# Patient Record
Sex: Female | Born: 1935 | Race: White | Hispanic: No | Marital: Married | Smoking: Never smoker
Health system: Southern US, Community
[De-identification: ages and names within clinical notes are randomized; demographics above are authoritative.]

## PROBLEM LIST (undated history)

## (undated) DIAGNOSIS — M810 Age-related osteoporosis without current pathological fracture: Secondary | ICD-10-CM

## (undated) DIAGNOSIS — F419 Anxiety disorder, unspecified: Secondary | ICD-10-CM

## (undated) DIAGNOSIS — D0510 Intraductal carcinoma in situ of unspecified breast: Secondary | ICD-10-CM

## (undated) DIAGNOSIS — I319 Disease of pericardium, unspecified: Secondary | ICD-10-CM

## (undated) HISTORY — DX: Disease of pericardium, unspecified: I31.9

## (undated) HISTORY — DX: Age-related osteoporosis without current pathological fracture: M81.0

## (undated) HISTORY — DX: Anxiety disorder, unspecified: F41.9

## (undated) HISTORY — DX: Intraductal carcinoma in situ of unspecified breast: D05.10

## (undated) HISTORY — PX: BREAST LUMPECTOMY: SHX2

---

## 1997-12-10 ENCOUNTER — Other Ambulatory Visit: Admission: RE | Admit: 1997-12-10 | Discharge: 1997-12-10 | Payer: Self-pay | Admitting: Obstetrics and Gynecology

## 1998-11-29 ENCOUNTER — Ambulatory Visit (HOSPITAL_COMMUNITY): Admission: RE | Admit: 1998-11-29 | Discharge: 1998-11-29 | Payer: Self-pay | Admitting: Gastroenterology

## 1999-01-03 ENCOUNTER — Other Ambulatory Visit: Admission: RE | Admit: 1999-01-03 | Discharge: 1999-01-03 | Payer: Self-pay | Admitting: Obstetrics and Gynecology

## 2000-01-16 ENCOUNTER — Other Ambulatory Visit: Admission: RE | Admit: 2000-01-16 | Discharge: 2000-01-16 | Payer: Self-pay | Admitting: Obstetrics and Gynecology

## 2001-01-05 ENCOUNTER — Other Ambulatory Visit: Admission: RE | Admit: 2001-01-05 | Discharge: 2001-01-05 | Payer: Self-pay | Admitting: Radiology

## 2001-02-02 ENCOUNTER — Other Ambulatory Visit: Admission: RE | Admit: 2001-02-02 | Discharge: 2001-02-02 | Payer: Self-pay | Admitting: Radiology

## 2001-02-10 ENCOUNTER — Ambulatory Visit: Admission: RE | Admit: 2001-02-10 | Discharge: 2001-05-11 | Payer: Self-pay | Admitting: *Deleted

## 2001-02-10 ENCOUNTER — Other Ambulatory Visit: Admission: RE | Admit: 2001-02-10 | Discharge: 2001-02-10 | Payer: Self-pay | Admitting: Obstetrics and Gynecology

## 2002-02-28 ENCOUNTER — Other Ambulatory Visit: Admission: RE | Admit: 2002-02-28 | Discharge: 2002-02-28 | Payer: Self-pay | Admitting: Obstetrics and Gynecology

## 2003-03-26 ENCOUNTER — Other Ambulatory Visit: Admission: RE | Admit: 2003-03-26 | Discharge: 2003-03-26 | Payer: Self-pay | Admitting: Obstetrics and Gynecology

## 2011-08-27 ENCOUNTER — Other Ambulatory Visit: Payer: Self-pay | Admitting: Internal Medicine

## 2011-08-27 DIAGNOSIS — R102 Pelvic and perineal pain: Secondary | ICD-10-CM

## 2011-08-31 ENCOUNTER — Ambulatory Visit
Admission: RE | Admit: 2011-08-31 | Discharge: 2011-08-31 | Disposition: A | Discharge status: Home / Self Care | Payer: Medicare Other | Source: Ambulatory Visit | Attending: Internal Medicine | Admitting: Internal Medicine

## 2011-08-31 DIAGNOSIS — R102 Pelvic and perineal pain: Secondary | ICD-10-CM

## 2014-08-27 DIAGNOSIS — K120 Recurrent oral aphthae: Secondary | ICD-10-CM | POA: Diagnosis not present

## 2014-09-26 DIAGNOSIS — L820 Inflamed seborrheic keratosis: Secondary | ICD-10-CM | POA: Diagnosis not present

## 2014-09-26 DIAGNOSIS — D485 Neoplasm of uncertain behavior of skin: Secondary | ICD-10-CM | POA: Diagnosis not present

## 2014-10-29 DIAGNOSIS — E780 Pure hypercholesterolemia: Secondary | ICD-10-CM | POA: Diagnosis not present

## 2014-10-29 DIAGNOSIS — Z0000 Encounter for general adult medical examination without abnormal findings: Secondary | ICD-10-CM | POA: Diagnosis not present

## 2014-10-29 DIAGNOSIS — M545 Low back pain: Secondary | ICD-10-CM | POA: Diagnosis not present

## 2014-10-29 DIAGNOSIS — I319 Disease of pericardium, unspecified: Secondary | ICD-10-CM | POA: Diagnosis not present

## 2014-11-14 DIAGNOSIS — Z23 Encounter for immunization: Secondary | ICD-10-CM | POA: Diagnosis not present

## 2014-11-14 DIAGNOSIS — M160 Bilateral primary osteoarthritis of hip: Secondary | ICD-10-CM | POA: Diagnosis not present

## 2014-11-14 DIAGNOSIS — M545 Low back pain: Secondary | ICD-10-CM | POA: Diagnosis not present

## 2014-11-14 DIAGNOSIS — Z0000 Encounter for general adult medical examination without abnormal findings: Secondary | ICD-10-CM | POA: Diagnosis not present

## 2014-11-14 DIAGNOSIS — M47816 Spondylosis without myelopathy or radiculopathy, lumbar region: Secondary | ICD-10-CM | POA: Diagnosis not present

## 2014-11-22 DIAGNOSIS — M545 Low back pain: Secondary | ICD-10-CM | POA: Diagnosis not present

## 2014-11-28 DIAGNOSIS — M545 Low back pain: Secondary | ICD-10-CM | POA: Diagnosis not present

## 2014-12-26 DIAGNOSIS — Z853 Personal history of malignant neoplasm of breast: Secondary | ICD-10-CM | POA: Diagnosis not present

## 2014-12-26 DIAGNOSIS — Z1231 Encounter for screening mammogram for malignant neoplasm of breast: Secondary | ICD-10-CM | POA: Diagnosis not present

## 2015-01-23 DIAGNOSIS — Z01419 Encounter for gynecological examination (general) (routine) without abnormal findings: Secondary | ICD-10-CM | POA: Diagnosis not present

## 2015-01-23 DIAGNOSIS — Z6825 Body mass index (BMI) 25.0-25.9, adult: Secondary | ICD-10-CM | POA: Diagnosis not present

## 2015-03-15 DIAGNOSIS — H2513 Age-related nuclear cataract, bilateral: Secondary | ICD-10-CM | POA: Diagnosis not present

## 2015-03-15 DIAGNOSIS — H1789 Other corneal scars and opacities: Secondary | ICD-10-CM | POA: Diagnosis not present

## 2015-03-15 DIAGNOSIS — Z0100 Encounter for examination of eyes and vision without abnormal findings: Secondary | ICD-10-CM | POA: Diagnosis not present

## 2015-05-27 DIAGNOSIS — Z23 Encounter for immunization: Secondary | ICD-10-CM | POA: Diagnosis not present

## 2015-09-04 DIAGNOSIS — N644 Mastodynia: Secondary | ICD-10-CM | POA: Diagnosis not present

## 2015-09-24 DIAGNOSIS — N644 Mastodynia: Secondary | ICD-10-CM | POA: Diagnosis not present

## 2015-09-24 DIAGNOSIS — Z853 Personal history of malignant neoplasm of breast: Secondary | ICD-10-CM | POA: Diagnosis not present

## 2015-10-07 DIAGNOSIS — L649 Androgenic alopecia, unspecified: Secondary | ICD-10-CM | POA: Diagnosis not present

## 2015-10-07 DIAGNOSIS — L814 Other melanin hyperpigmentation: Secondary | ICD-10-CM | POA: Diagnosis not present

## 2015-12-16 ENCOUNTER — Other Ambulatory Visit: Payer: Self-pay | Admitting: Internal Medicine

## 2015-12-16 DIAGNOSIS — R911 Solitary pulmonary nodule: Secondary | ICD-10-CM

## 2015-12-19 ENCOUNTER — Other Ambulatory Visit: Payer: BC Managed Care – PPO

## 2015-12-30 ENCOUNTER — Other Ambulatory Visit: Payer: BC Managed Care – PPO

## 2016-01-01 ENCOUNTER — Ambulatory Visit
Admission: RE | Admit: 2016-01-01 | Discharge: 2016-01-01 | Disposition: A | Discharge status: Home / Self Care | Payer: PRIVATE HEALTH INSURANCE | Source: Ambulatory Visit | Attending: Internal Medicine | Admitting: Internal Medicine

## 2016-01-01 DIAGNOSIS — R911 Solitary pulmonary nodule: Secondary | ICD-10-CM

## 2016-01-01 MED ORDER — IOPAMIDOL (ISOVUE-300) INJECTION 61%
75.0000 mL | Freq: Once | INTRAVENOUS | Status: AC | PRN
  Administered 2016-01-01: 75 mL via INTRAVENOUS

## 2016-01-09 ENCOUNTER — Ambulatory Visit: Payer: BC Managed Care – PPO | Admitting: Cardiovascular Disease

## 2016-01-15 ENCOUNTER — Other Ambulatory Visit: Payer: Self-pay | Admitting: Internal Medicine

## 2016-01-15 DIAGNOSIS — R911 Solitary pulmonary nodule: Secondary | ICD-10-CM

## 2016-01-23 ENCOUNTER — Telehealth: Payer: Self-pay | Admitting: Cardiology

## 2016-01-23 NOTE — Telephone Encounter (Signed)
Received records from [facility] for appointment on 05/07/16 with Dr Martinique.  Records given to [facility] (medical records) for Dr Doug Sou schedule on 05/07/16. lp

## 2016-03-03 ENCOUNTER — Ambulatory Visit
Admission: RE | Admit: 2016-03-03 | Discharge: 2016-03-03 | Disposition: A | Discharge status: Home / Self Care | Payer: Medicare Other | Source: Ambulatory Visit | Attending: Internal Medicine | Admitting: Internal Medicine

## 2016-03-03 DIAGNOSIS — R911 Solitary pulmonary nodule: Secondary | ICD-10-CM

## 2016-05-06 NOTE — Progress Notes (Deleted)
   Cardiology Office Note    Date:  05/06/2016   ID:  Stacie Gray, DOB 26-Dec-1933, MRN [id number]  PCP:  No primary care provider on file.  Cardiologist:  Carrera Kiesel Martinique, MD    History of Present Illness:  Stacie Gray is a 82 y.o. female ***    No past medical history on file.  No past surgical history on file.  Current Medications: No outpatient prescriptions prior to visit.   No facility-administered medications prior to visit.      Allergies:   Review of patient's allergies indicates not on file.   Social History   Social History  . Marital status: Married    Spouse name: N/A  . Number of children: N/A  . Years of education: N/A   Social History Main Topics  . Smoking status: Not on file  . Smokeless tobacco: Not on file  . Alcohol use Not on file  . Drug use: Unknown  . Sexual activity: Not on file   Other Topics Concern  . Not on file   Social History Narrative  . No narrative on file     Family History:  The patient's ***family history is not on file.   ROS:   Please see the history of present illness.    ROS All other systems reviewed and are negative.   PHYSICAL EXAM:   VS:  There were no vitals taken for this visit.   GEN: Well nourished, well developed, in no acute distress  HEENT: normal  Neck: no JVD, carotid bruits, or masses Cardiac: ***RRR; no murmurs, rubs, or gallops,no edema  Respiratory:  clear to auscultation bilaterally, normal work of breathing GI: soft, nontender, nondistended, + BS MS: no deformity or atrophy  Skin: warm and dry, no rash Neuro:  Alert and Oriented x 3, Strength and sensation are intact Psych: euthymic mood, full affect  Wt Readings from Last 3 Encounters:  No data found for Wt      Studies/Labs Reviewed:   EKG:  EKG is*** ordered today.  The ekg ordered today demonstrates ***  Recent Labs: No results found for requested labs within last 8760 hours.   Lipid Panel No results found  for: CHOL, TRIG, HDL, CHOLHDL, VLDL, LDLCALC, LDLDIRECT  Additional studies/ records that were reviewed today include:  Labs from primary care dated 12/03/15: cholesterol 173, triglycerides 102, LDL 105, HDL 48.  Dated 03/09/16: BUN 18, creatinine 0.95, other chemistries normal. TSH normal.   .padscreen  ASSESSMENT:    No diagnosis found.   PLAN:  In order of problems listed above:  1. ***    Medication Adjustments/Labs and Tests Ordered: Current medicines are reviewed at length with the patient today.  Concerns regarding medicines are outlined above.  Medication changes, Labs and Tests ordered today are listed in the Patient Instructions below. There are no Patient Instructions on file for this visit.   Signed, Hadassa Cermak Martinique, MD  05/06/2016 4:47 PM    [facility] [address] [place], [place], [address] [phone]

## 2016-05-07 ENCOUNTER — Encounter: Payer: Self-pay | Admitting: *Deleted

## 2016-05-07 ENCOUNTER — Ambulatory Visit: Payer: Medicare Other | Admitting: Cardiology

## 2016-05-20 ENCOUNTER — Encounter: Payer: Self-pay | Admitting: Internal Medicine

## 2016-05-20 ENCOUNTER — Ambulatory Visit (INDEPENDENT_AMBULATORY_CARE_PROVIDER_SITE_OTHER): Payer: Medicare Other | Admitting: Internal Medicine

## 2016-05-20 VITALS — BP 152/76 | HR 62 | Ht 65.0 in | Wt 147.0 lb

## 2016-05-20 DIAGNOSIS — F419 Anxiety disorder, unspecified: Secondary | ICD-10-CM | POA: Insufficient documentation

## 2016-05-20 DIAGNOSIS — R0602 Shortness of breath: Secondary | ICD-10-CM | POA: Diagnosis not present

## 2016-05-20 DIAGNOSIS — R9431 Abnormal electrocardiogram [ECG] [EKG]: Secondary | ICD-10-CM | POA: Insufficient documentation

## 2016-05-20 DIAGNOSIS — Z8679 Personal history of other diseases of the circulatory system: Secondary | ICD-10-CM | POA: Insufficient documentation

## 2016-05-20 NOTE — Progress Notes (Signed)
OFFICE NOTE  Chief Complaint:  Dyspnea on exertion  Primary Care Physician: Stacie Gravel, MD  HPI:  Stacie Gray is a 82 y.o. female with a past medical history significant for remote pericarditis in the setting of fever however she did not have any chest pain in 1996, DCIS status post lumpectomy in 2002, anxiety and osteoarthritis. Recently she's been having some more shortness of breath which she feels may be related to anxiety. She's been caring for her husband who is COPD. She's had several episodes where she felt like she was anxious and her heart was racing however the anxiety likely preceded the tachycardia. She denies any chest pain but does get short of breath when doing some activities. There is no significant family history of coronary disease and she has few cardiac risk factors other than age. There was a remote history of possible pericarditis in the 1990s where she had fever but did not have chest discomfort. She had an echo at that time, but those results are not immediately available. EKG was performed in the office today which was abnormal showing sinus rhythm at 62, possible anterior infarct pattern and T-wave abnormality laterally concerning for ischemia.  PMHx:  Past Medical History:  Diagnosis Date  . Anxiety   . DCIS (ductal carcinoma in situ)   . Osteoporosis   . Pericarditis     Past Surgical History:  Procedure Laterality Date  . BREAST LUMPECTOMY      FAMHx:  Family History  Problem Relation Age of Onset  . Heart disease Paternal Grandmother   . Stroke Paternal Grandfather     SOCHx:   reports that she has never smoked. She has never used smokeless tobacco. She reports that she does not drink alcohol or use drugs.  ALLERGIES:  No Known Allergies  ROS: Pertinent items noted in HPI and remainder of comprehensive ROS otherwise negative.  HOME MEDS: No current outpatient prescriptions on file prior to visit.   No current  facility-administered medications on file prior to visit.     LABS/IMAGING: No results found for this or any previous visit (from the past 48 hour(s)). No results found.  WEIGHTS: Wt Readings from Last 3 Encounters:  05/20/16 147 lb (66.7 kg)    VITALS: BP (!) 152/76   Pulse 62   Ht 5\' 5"  (1.651 m)   Wt 147 lb (66.7 kg)   BMI 24.46 kg/m   EXAM: General appearance: alert and no distress Neck: no carotid bruit and no JVD Lungs: clear to auscultation bilaterally Heart: regular rate and rhythm, S1, S2 normal, no murmur, click, rub or gallop Abdomen: soft, non-tender; bowel sounds normal; no masses,  no organomegaly Extremities: extremities normal, atraumatic, no cyanosis or edema Pulses: 2+ and symmetric Skin: Skin color, texture, turgor normal. No rashes or lesions Neurologic: Grossly normal Psych: Pleasant  EKG: Normal sinus rhythm at 62, possible anterior infarct with poor R-wave progression and lateral ischemia and T-wave changes  ASSESSMENT: 1. Progressive dyspnea on exertion 2. Abnormal EKG with ischemic changes 3. Anxiety 4. Remote history of pericarditis  PLAN: 1.   Stacie Gray presents for some recent dyspnea with exertion. She feels that this may be related to anxiety panic. There is some racing of her heart that happens when she is anxious. She's been under a lot of stress caring for her husband who is COPD on oxygen. Her symptoms of recently worsened and there are abnormal EKG changes that could suggest ischemia. I recommend a  Lexiscan Myoview to rule out ischemia. She is unable to walk on a treadmill due to sciatic symptoms on her right leg. She does have a history of remote pericarditis an echo in the 1990s. I like to reassess echo as well for her shortness of breath. Plan to see her back in follow-up of the studies in a few weeks.  Thanks again for the kind referral.  Pixie Casino, MD, [facility] Attending Cardiologist [place] 05/20/2016, 10:28 AM

## 2016-05-20 NOTE — Patient Instructions (Signed)
Medication Instructions:  Your physician recommends that you continue on your current medications as directed. Please refer to the Current Medication list given to you today.  Labwork: None   Testing/Procedures: Your physician has requested that you have a lexiscan myoview. For further information please visit [email or web address]. Please follow instruction sheet, as given.  Your physician has requested that you have an echocardiogram. Echocardiography is a painless test that uses sound waves to create images of your heart. It provides your doctor with information about the size and shape of your heart and how well your heart's chambers and valves are working. This procedure takes approximately one hour. There are no restrictions for this procedure.   Follow-Up: Your physician recommends that you schedule a follow-up appointment in: AFTER TESTING IS COMPLETE  Any Other Special Instructions Will Be Listed Below (If Applicable).   If you need a refill on your cardiac medications before your next appointment, please call your pharmacy.  NEXT APPOINTMENT with [facility]  DATE:______________________________________  TIME:______________________________________AM/PM

## 2016-05-25 DIAGNOSIS — L821 Other seborrheic keratosis: Secondary | ICD-10-CM | POA: Diagnosis not present

## 2016-05-25 DIAGNOSIS — L820 Inflamed seborrheic keratosis: Secondary | ICD-10-CM | POA: Diagnosis not present

## 2016-05-25 DIAGNOSIS — L720 Epidermal cyst: Secondary | ICD-10-CM | POA: Diagnosis not present

## 2016-05-25 DIAGNOSIS — L723 Sebaceous cyst: Secondary | ICD-10-CM | POA: Diagnosis not present

## 2016-05-25 DIAGNOSIS — L738 Other specified follicular disorders: Secondary | ICD-10-CM | POA: Diagnosis not present

## 2016-06-02 ENCOUNTER — Telehealth (HOSPITAL_COMMUNITY): Payer: Self-pay

## 2016-06-02 ENCOUNTER — Other Ambulatory Visit: Payer: Self-pay

## 2016-06-02 ENCOUNTER — Ambulatory Visit (HOSPITAL_COMMUNITY): Payer: Medicare Other | Attending: Cardiology

## 2016-06-02 DIAGNOSIS — I5030 Unspecified diastolic (congestive) heart failure: Secondary | ICD-10-CM | POA: Diagnosis not present

## 2016-06-02 DIAGNOSIS — Z8679 Personal history of other diseases of the circulatory system: Secondary | ICD-10-CM | POA: Insufficient documentation

## 2016-06-02 DIAGNOSIS — R0602 Shortness of breath: Secondary | ICD-10-CM | POA: Insufficient documentation

## 2016-06-02 NOTE — Telephone Encounter (Signed)
Encounter complete. 

## 2016-06-04 ENCOUNTER — Ambulatory Visit (HOSPITAL_COMMUNITY)
Admission: RE | Admit: 2016-06-04 | Discharge: 2016-06-04 | Disposition: A | Discharge status: Home / Self Care | Payer: Medicare Other | Source: Ambulatory Visit | Attending: Cardiovascular Disease | Admitting: Cardiovascular Disease

## 2016-06-04 DIAGNOSIS — R0602 Shortness of breath: Secondary | ICD-10-CM

## 2016-06-04 DIAGNOSIS — Z8679 Personal history of other diseases of the circulatory system: Secondary | ICD-10-CM | POA: Diagnosis not present

## 2016-06-04 LAB — MYOCARDIAL PERFUSION IMAGING
CHL CUP NUCLEAR SDS: 0
CHL CUP NUCLEAR SRS: 0
CHL CUP NUCLEAR SSS: 0
CSEPPHR: 91 {beats}/min
LV dias vol: 71 mL (ref 46–106)
LV sys vol: 26 mL
Rest HR: 53 {beats}/min
TID: 1.31

## 2016-06-04 MED ORDER — TECHNETIUM TC 99M TETROFOSMIN IV KIT
10.5000 | PACK | Freq: Once | INTRAVENOUS | Status: AC | PRN
  Administered 2016-06-04: 10.5 via INTRAVENOUS
  Filled 2016-06-04: qty 11

## 2016-06-04 MED ORDER — AMINOPHYLLINE 25 MG/ML IV SOLN
75.0000 mg | Freq: Once | INTRAVENOUS | Status: AC
  Administered 2016-06-04: 75 mg via INTRAVENOUS

## 2016-06-04 MED ORDER — TECHNETIUM TC 99M TETROFOSMIN IV KIT
30.7000 | PACK | Freq: Once | INTRAVENOUS | Status: AC | PRN
  Administered 2016-06-04: 30.7 via INTRAVENOUS
  Filled 2016-06-04: qty 31

## 2016-06-04 MED ORDER — REGADENOSON 0.4 MG/5ML IV SOLN
0.4000 mg | Freq: Once | INTRAVENOUS | Status: AC
  Administered 2016-06-04: 0.4 mg via INTRAVENOUS

## 2016-07-08 ENCOUNTER — Ambulatory Visit: Payer: Medicare Other | Admitting: Internal Medicine

## 2016-07-20 DIAGNOSIS — Z23 Encounter for immunization: Secondary | ICD-10-CM | POA: Diagnosis not present

## 2016-07-28 ENCOUNTER — Ambulatory Visit (INDEPENDENT_AMBULATORY_CARE_PROVIDER_SITE_OTHER): Payer: Medicare Other | Admitting: Internal Medicine

## 2016-07-28 ENCOUNTER — Encounter: Payer: Self-pay | Admitting: Internal Medicine

## 2016-07-28 VITALS — BP 149/80 | HR 63 | Ht 65.0 in | Wt 143.6 lb

## 2016-07-28 DIAGNOSIS — R0602 Shortness of breath: Secondary | ICD-10-CM

## 2016-07-28 DIAGNOSIS — R9431 Abnormal electrocardiogram [ECG] [EKG]: Secondary | ICD-10-CM | POA: Diagnosis not present

## 2016-07-28 DIAGNOSIS — I10 Essential (primary) hypertension: Secondary | ICD-10-CM | POA: Diagnosis not present

## 2016-07-28 MED ORDER — VALSARTAN 80 MG PO TABS: 80.0000 mg | ORAL_TABLET | Freq: Every day | ORAL | 6 refills | Status: DC

## 2016-07-28 NOTE — Progress Notes (Signed)
OFFICE NOTE  Chief Complaint:  Dyspnea on exertion, headache  Primary Care Physician: Jani Gravel, MD  HPI:  Stacie Gray is a 82 y.o. female with a past medical history significant for remote pericarditis in the setting of fever however she did not have any chest pain in 1996, DCIS status post lumpectomy in 2002, anxiety and osteoarthritis. Recently she's been having some more shortness of breath which she feels may be related to anxiety. She's been caring for her husband who is COPD. She's had several episodes where she felt like she was anxious and her heart was racing however the anxiety likely preceded the tachycardia. She denies any chest pain but does get short of breath when doing some activities. There is no significant family history of coronary disease and she has few cardiac risk factors other than age. There was a remote history of possible pericarditis in the 1990s where she had fever but did not have chest discomfort. She had an echo at that time, but those results are not immediately available. EKG was performed in the office today which was abnormal showing sinus rhythm at 62, possible anterior infarct pattern and T-wave abnormality laterally concerning for ischemia.  07/28/2016  Stacie Gray returns today for follow-up. I'm pleased to report her nuclear stress test was negative for ischemia. LV function is stable. Her echo shows normal LV systolic function with mild diastolic dysfunction and trivial aortic insufficiency. This does not necessarily explain her shortness of breath or chest discomfort. She is under significant amount of stress however caring for her husband and I think that's also contributing to her issues. Blood pressure notably was elevated previously 152/76. Today it's elevated 149/80. I suspect the hypertension could be causing some of her symptoms. She does report some tension over her temples which may occur in the afternoons and could represent a  manifestation of her hypertension.  PMHx:  Past Medical History:  Diagnosis Date  . Anxiety   . DCIS (ductal carcinoma in situ)   . Osteoporosis   . Pericarditis     Past Surgical History:  Procedure Laterality Date  . BREAST LUMPECTOMY      FAMHx:  Family History  Problem Relation Age of Onset  . Heart disease Paternal Grandmother   . Stroke Paternal Grandfather     SOCHx:   reports that she has never smoked. She has never used smokeless tobacco. She reports that she does not drink alcohol or use drugs.  ALLERGIES:  No Known Allergies  ROS: Pertinent items noted in HPI and remainder of comprehensive ROS otherwise negative.  HOME MEDS: Current Outpatient Prescriptions on File Prior to Visit  Medication Sig Dispense Refill  . Cholecalciferol (VITAMIN D3 PO) Take 500 mg by mouth daily.    . Glucosamine-Chondroit-Vit C-Mn (GLUCOSAMINE 1500 COMPLEX PO) Take 1 tablet by mouth daily.    . Magnesium 500 MG TABS Take 500 mg by mouth daily.    . meloxicam (MOBIC) 15 MG tablet Take 15 mg by mouth daily.     No current facility-administered medications on file prior to visit.     LABS/IMAGING: No results found for this or any previous visit (from the past 48 hour(s)). No results found.  WEIGHTS: Wt Readings from Last 3 Encounters:  07/28/16 143 lb 9.6 oz (65.1 kg)  06/04/16 147 lb (66.7 kg)  05/20/16 147 lb (66.7 kg)    VITALS: BP (!) 149/80   Pulse 63   Ht 5\' 5"  (1.651 m)  Wt 143 lb 9.6 oz (65.1 kg)   BMI 23.90 kg/m   EXAM: Deferred  EKG: Deferred  ASSESSMENT: 1. Progressive dyspnea on exertion - low risk Myoview and normal LV EF on echo (06/2016) 2. Abnormal EKG with ischemic changes 3. Anxiety 4. Remote history of pericarditis 5. Hypertension  PLAN: 1.   Stacie Gray has had some dyspnea and chest tightness as well as mild abnormal feeling in her head which sounds like possible headache. Blood pressure has been consistently elevated which  suggested it could be a cause of her symptoms. I would recommend starting low-dose Diovan 80 mg daily. Will plan a follow-up appointment in a few weeks to reassess blood pressure and see if she's had any symptomatic improvement. Follow-up with me in 6 months.  Pixie Casino, MD, [facility] Attending Cardiologist [place] 07/28/2016, 3:16 PM

## 2016-07-28 NOTE — Patient Instructions (Signed)
Medication addition  Start valsartan 80 mg one tablet by mouth daily    Your physician recommends that you schedule a follow-up appointment in JAN 2018 [place]- BLOOD PRESSURE.    Your physician wants you to follow-up in [place]. You will receive a reminder letter in the mail two months in advance. If you don't receive a letter, please call our office to schedule the follow-up appointment.  If you need a refill on your cardiac medications before your next appointment, please call your pharmacy.

## 2016-09-03 ENCOUNTER — Ambulatory Visit: Payer: Medicare Other

## 2016-09-08 ENCOUNTER — Ambulatory Visit (INDEPENDENT_AMBULATORY_CARE_PROVIDER_SITE_OTHER): Payer: Medicare Other | Admitting: Pharmacist Clinician (PhC)/ Clinical Pharmacy Specialist

## 2016-09-08 ENCOUNTER — Other Ambulatory Visit: Payer: Self-pay | Admitting: Internal Medicine

## 2016-09-08 DIAGNOSIS — R911 Solitary pulmonary nodule: Secondary | ICD-10-CM

## 2016-09-08 DIAGNOSIS — I10 Essential (primary) hypertension: Secondary | ICD-10-CM | POA: Diagnosis not present

## 2016-09-08 MED ORDER — VALSARTAN 80 MG PO TABS: 80.0000 mg | ORAL_TABLET | Freq: Every day | ORAL | 1 refills | Status: DC

## 2016-09-08 NOTE — Progress Notes (Signed)
     09/08/2016 Stacie Gray Feb 07, 1934 [id number]   HPI:  Stacie Gray is a 83 y.o. female patient of Dr Debara Pickett, with a PMH below who presents today for hypertension clinic evaluation.  Her cardiac history is significant only for mild diastolic dysfunction.  She has had some problems with chest discomfort, but she believes this to be related to stress.  Her husband is not able to get around easily or do much, which places extra burdens on her.  She has never taken any medications for hypertension until she saw Dr. Debara Pickett in December.  She reports no problems with the valsartan.  Her chart listed meloxicam as a daily medication, however she tells me that she only took it for a short time, as it did nothing to help with arthritis-type pain in her knee.  She finds that 1-2 ibuprofen at bedtime work much better.    Blood Pressure Goal:  130/800   Current Medications:  Valsartan 80 mg  Family Hx:  Nothing significant, believes her brother and sister both have hypertension;   Social Hx:  No tobacco or alcohol, drinks 1-2 cups of coffee most mornings, occasional cup if they go out to dinner  Diet:  Cooks most meals at home, does not add salt to her foods or when cooking  Exercise:  Nothing regular, does still do her own housework and cares for her husband  Home BP readings:  Does not have home cuff, no interest in purchasing one   Wt Readings from Last 3 Encounters:  07/28/16 143 lb 9.6 oz (65.1 kg)  06/04/16 147 lb (66.7 kg)  05/20/16 147 lb (66.7 kg)   BP Readings from Last 3 Encounters:  09/08/16 128/80  07/28/16 (!) 149/80  05/20/16 (!) 152/76   Pulse Readings from Last 3 Encounters:  09/08/16 (!) 56  07/28/16 63  05/20/16 62    Current Outpatient Prescriptions  Medication Sig Dispense Refill  . Cholecalciferol (VITAMIN D3 PO) Take 500 mg by mouth daily.    . clonazePAM (KLONOPIN) 0.5 MG tablet Take 0.5 mg by mouth at bedtime as needed.  4  . Magnesium 500  MG TABS Take 500 mg by mouth daily.    . valsartan (DIOVAN) 80 MG tablet Take 1 tablet (80 mg total) by mouth daily. 90 tablet 1   No current facility-administered medications for this visit.     No Known Allergies  Past Medical History:  Diagnosis Date  . Anxiety   . DCIS (ductal carcinoma in situ)   . Osteoporosis   . Pericarditis     Blood pressure 128/80, pulse (!) 56, height 5\' 5"  (1.651 m).  Essential hypertension Patient returned for BP check today.  Pressure looked good at 128/80, with only small change upon standing (122/84).  She will continue with the valsartan 80 mg daily.  She does not have a home BP cuff, and I don't think she needs one.  She can check her BP with other providers or at local pharmacy if she is curious.  Should she note an increase in pressure she will call the office.    Tommy Medal PharmD CPP [facility]

## 2016-09-08 NOTE — Patient Instructions (Signed)
   Your blood pressure today is 128/80  (goal is < 130/80)  Take your BP meds as follows:  Continue with valsartan 80 mg daily  Bring all of your meds, your BP cuff and your record of home blood pressures to your next appointment.  Exercise as you're able, try to walk approximately 30 minutes per day.  Keep salt intake to a minimum, especially watch canned and prepared boxed foods.  Eat more fresh fruits and vegetables and fewer canned items.  Avoid eating in fast food restaurants.    HOW TO TAKE YOUR BLOOD PRESSURE: . Rest 5 minutes before taking your blood pressure. .  Don't smoke or drink caffeinated beverages for at least 30 minutes before. . Take your blood pressure before (not after) you eat. . Sit comfortably with your back supported and both feet on the floor (don't cross your legs). . Elevate your arm to heart level on a table or a desk. . Use the proper sized cuff. It should fit smoothly and snugly around your bare upper arm. There should be enough room to slip a fingertip under the cuff. The bottom edge of the cuff should be 1 inch above the crease of the elbow. . Ideally, take 3 measurements at one sitting and record the average.

## 2016-09-08 NOTE — Assessment & Plan Note (Signed)
Patient returned for BP check today.  Pressure looked good at 128/80, with only small change upon standing (122/84).  She will continue with the valsartan 80 mg daily.  She does not have a home BP cuff, and I don't think she needs one.  She can check her BP with other providers or at local pharmacy if she is curious.  Should she note an increase in pressure she will call the office.

## 2016-09-24 ENCOUNTER — Ambulatory Visit
Admission: RE | Admit: 2016-09-24 | Discharge: 2016-09-24 | Disposition: A | Discharge status: Home / Self Care | Payer: Medicare Other | Source: Ambulatory Visit | Attending: Internal Medicine | Admitting: Internal Medicine

## 2016-09-24 DIAGNOSIS — R911 Solitary pulmonary nodule: Secondary | ICD-10-CM

## 2017-02-24 ENCOUNTER — Ambulatory Visit: Payer: Medicare Other | Admitting: Internal Medicine

## 2017-04-07 ENCOUNTER — Ambulatory Visit: Payer: Medicare Other | Admitting: Internal Medicine

## 2017-09-12 ENCOUNTER — Other Ambulatory Visit: Payer: Self-pay | Admitting: Internal Medicine

## 2017-09-12 DIAGNOSIS — R918 Other nonspecific abnormal finding of lung field: Secondary | ICD-10-CM

## 2017-09-15 ENCOUNTER — Ambulatory Visit
Admission: RE | Admit: 2017-09-15 | Discharge: 2017-09-15 | Disposition: A | Discharge status: Home / Self Care | Payer: Medicare Other | Source: Ambulatory Visit | Attending: Internal Medicine | Admitting: Internal Medicine

## 2017-09-15 DIAGNOSIS — R918 Other nonspecific abnormal finding of lung field: Secondary | ICD-10-CM

## 2017-11-18 ENCOUNTER — Ambulatory Visit: Payer: Medicare Other | Admitting: Sports Medicine

## 2017-11-23 ENCOUNTER — Ambulatory Visit: Payer: Medicare Other | Admitting: Sports Medicine

## 2017-11-23 ENCOUNTER — Ambulatory Visit
Admission: RE | Admit: 2017-11-23 | Discharge: 2017-11-23 | Disposition: A | Discharge status: Home / Self Care | Payer: Medicare Other | Source: Ambulatory Visit | Attending: Sports Medicine | Admitting: Sports Medicine

## 2017-11-23 VITALS — BP 122/60 | Ht 64.5 in | Wt 145.0 lb

## 2017-11-23 DIAGNOSIS — M25511 Pain in right shoulder: Secondary | ICD-10-CM

## 2017-11-24 ENCOUNTER — Encounter: Payer: Self-pay | Admitting: Sports Medicine

## 2017-11-24 NOTE — Progress Notes (Signed)
   Subjective:    Patient ID: Stacie Gray, female    DOB: 1934/03/22, 84 y.o.   MRN: [id number]  HPI chief complaint: Right arm pain  Very pleasant 84 year old right-hand-dominant female comes in today complaining of 1 year of right upper arm pain. She denies any trauma but rather describes a gradual onset of pain that started shortly after she was having to help her ill husband. She started to notice pain after having to carry his oxygen tank around. She describes an achy discomfort that she localizes to the proximal humerus. It is most noticeable with reaching away from her body to pick up heavy objects. She has not noticed any weakness. She denies pain that awakens her at night. She denies numbness or tingling. No neck pain.No problems with this shoulder in the past. No imaging. She was recently evaluated by her primary care physician who recommended referral to our office for further workup and treatment. She's not currently taking any medication for her pain.  Past medical history reviewed Medications reviewed Allergies reviewed    Review of Systems    as above Objective:   Physical Exam  Well-developed, well-nourished. No acute distress. Awake alert and oriented 3. Vital signs reviewed  Right shoulder: Patient has full range of motion but does have reproducible pain with internal rotation actively. Equivocal empty can,positive Hawkins. No atrophy. Rotator cuff strength is 5/5 and slightly reproducible of pain with resisted supraspinatus. No tenderness in the bicipital groove. Negative O'Brien. No tenderness to palpation over the acromioclavicular joint. Neurovascularly intact distally.  Left shoulder: Full active and passive range of motion. Negative empty can, negative Hawkins. Rotator cuff strength is 5/5. Neurovascularly intact distally.  X-rays of the right shoulder including AP and axillary views shows mild to moderate spurring at the acromioclavicular joint. No  significant degenerative changes at the glenohumeral joint. Nothing acute.      Assessment & Plan:   Right shoulder pain likely secondary to rotator cuff tendinopathy versus partial tearing  Patient has full strength on exam which is reassuring. However, she likely has rotator cuff tendinopathy or partial tearing so I recommend that we schedule a return visit for a right shoulder ultrasound to evaluate further. Patient is in agreement with that plan. She will return to the office the week of April 22 for that study and we will delineate a more definitive treatment plan based on those findings.

## 2017-12-07 ENCOUNTER — Ambulatory Visit: Payer: Medicare Other | Admitting: Sports Medicine

## 2017-12-07 VITALS — BP 118/70 | Ht 64.5 in | Wt 145.0 lb

## 2017-12-07 DIAGNOSIS — M25562 Pain in left knee: Secondary | ICD-10-CM | POA: Diagnosis not present

## 2017-12-07 DIAGNOSIS — M25462 Effusion, left knee: Secondary | ICD-10-CM

## 2017-12-07 DIAGNOSIS — M25511 Pain in right shoulder: Secondary | ICD-10-CM

## 2017-12-08 NOTE — Progress Notes (Signed)
   Subjective:    Patient ID: Stacie Gray, female    DOB: [birth year], 84 y.o.   MRN: [id number]  HPI   Patient comes in today initially for a right shoulder ultrasound but she is questioning whether or not this is necessary. Her x-rays showed some degenerative changes at the acromioclavicular joint but no significant changes in the glenohumeral joint. She continues to endorse lateral shoulder pain when lifting heavy objects. However, she has not noticed any weakness and she denies nighttime pain. She is not interested in any further treatment at this time other than possibly some home exercises.Symptoms are certainly not bad enough to warrant aggressive treatment. She is also complaining of some left knee swelling which she noticed this morning. She denies trauma but does have a history of what sounds like sciatica. She initially thought the swelling might be from sciatica but she denies previous swelling in the knee. She denies locking or catching in the knee. No feelings of instability.    Review of Systems    as above Objective:   Physical Exam  Well-developed, well-nourished. No acute distress. Awake alert and oriented 3. Vital signs reviewed  Right shoulder:Full painless range of motion. No tenderness to palpation. She does still have some pain with empty can testing a rotator cuff strength is 5/5 and minimally producible of pain. She is neurovascular intact distally.  Left knee: Trace to 1+ effusion. Range of motion is 0-120.She is tender to palpation along the medial joint line and does have some bony hypertrophy here likely consistent with DJD. Neurovascularly intact distally.      Assessment & Plan:   Right shoulder pain likely secondary to rotator cuff tendinopathy versus possible partial tearing Left knee swelling likely secondary to DJD  Patient is given a home exercise program for her right shoulder. Her symptoms are currently tolerable but she understands that we  could consider more aggressive workup and treatment at a later date if needed including a subacromial cortisone injection or ultrasound. For the left knee, I recommended that she purchase a compression sleeve and wear it during the day when active. If swelling persists or knee pain worsens then we could consider x-rays versus cortisone injection. Patient will follow-up as needed.

## 2019-09-18 DIAGNOSIS — N390 Urinary tract infection, site not specified: Secondary | ICD-10-CM | POA: Diagnosis not present

## 2019-09-18 DIAGNOSIS — E559 Vitamin D deficiency, unspecified: Secondary | ICD-10-CM | POA: Diagnosis not present

## 2019-09-18 DIAGNOSIS — E7800 Pure hypercholesterolemia, unspecified: Secondary | ICD-10-CM | POA: Diagnosis not present

## 2019-09-18 DIAGNOSIS — I10 Essential (primary) hypertension: Secondary | ICD-10-CM | POA: Diagnosis not present

## 2019-09-18 DIAGNOSIS — Z79899 Other long term (current) drug therapy: Secondary | ICD-10-CM | POA: Diagnosis not present

## 2019-09-20 DIAGNOSIS — F419 Anxiety disorder, unspecified: Secondary | ICD-10-CM | POA: Diagnosis not present

## 2019-09-20 DIAGNOSIS — I10 Essential (primary) hypertension: Secondary | ICD-10-CM | POA: Diagnosis not present

## 2019-09-20 DIAGNOSIS — E7800 Pure hypercholesterolemia, unspecified: Secondary | ICD-10-CM | POA: Diagnosis not present

## 2019-11-03 DIAGNOSIS — R3914 Feeling of incomplete bladder emptying: Secondary | ICD-10-CM | POA: Diagnosis not present

## 2019-11-14 ENCOUNTER — Other Ambulatory Visit: Payer: Self-pay | Admitting: Internal Medicine

## 2019-11-14 DIAGNOSIS — K5900 Constipation, unspecified: Secondary | ICD-10-CM | POA: Diagnosis not present

## 2019-11-14 DIAGNOSIS — R1011 Right upper quadrant pain: Secondary | ICD-10-CM

## 2019-11-20 ENCOUNTER — Other Ambulatory Visit: Payer: Medicare Other

## 2019-11-23 ENCOUNTER — Ambulatory Visit: Payer: Medicare PPO

## 2019-11-29 ENCOUNTER — Ambulatory Visit
Admission: RE | Admit: 2019-11-29 | Discharge: 2019-11-29 | Disposition: A | Discharge status: Home / Self Care | Payer: Medicare PPO | Source: Ambulatory Visit | Attending: Internal Medicine | Admitting: Internal Medicine

## 2019-11-29 DIAGNOSIS — K8020 Calculus of gallbladder without cholecystitis without obstruction: Secondary | ICD-10-CM | POA: Diagnosis not present

## 2019-11-29 DIAGNOSIS — K760 Fatty (change of) liver, not elsewhere classified: Secondary | ICD-10-CM | POA: Diagnosis not present

## 2019-11-29 DIAGNOSIS — R1011 Right upper quadrant pain: Secondary | ICD-10-CM

## 2019-11-29 DIAGNOSIS — K5900 Constipation, unspecified: Secondary | ICD-10-CM

## 2020-01-10 DIAGNOSIS — J309 Allergic rhinitis, unspecified: Secondary | ICD-10-CM | POA: Diagnosis not present

## 2020-03-18 DIAGNOSIS — Z1231 Encounter for screening mammogram for malignant neoplasm of breast: Secondary | ICD-10-CM | POA: Diagnosis not present

## 2020-03-25 DIAGNOSIS — E7800 Pure hypercholesterolemia, unspecified: Secondary | ICD-10-CM | POA: Diagnosis not present

## 2020-04-01 DIAGNOSIS — E7800 Pure hypercholesterolemia, unspecified: Secondary | ICD-10-CM | POA: Diagnosis not present

## 2020-04-01 DIAGNOSIS — I10 Essential (primary) hypertension: Secondary | ICD-10-CM | POA: Diagnosis not present

## 2020-04-01 DIAGNOSIS — E559 Vitamin D deficiency, unspecified: Secondary | ICD-10-CM | POA: Diagnosis not present

## 2020-04-08 DIAGNOSIS — L738 Other specified follicular disorders: Secondary | ICD-10-CM | POA: Diagnosis not present

## 2020-04-08 DIAGNOSIS — L821 Other seborrheic keratosis: Secondary | ICD-10-CM | POA: Diagnosis not present

## 2020-04-08 DIAGNOSIS — L723 Sebaceous cyst: Secondary | ICD-10-CM | POA: Diagnosis not present

## 2020-04-08 DIAGNOSIS — L718 Other rosacea: Secondary | ICD-10-CM | POA: Diagnosis not present

## 2020-04-08 DIAGNOSIS — L720 Epidermal cyst: Secondary | ICD-10-CM | POA: Diagnosis not present

## 2020-04-08 DIAGNOSIS — L820 Inflamed seborrheic keratosis: Secondary | ICD-10-CM | POA: Diagnosis not present

## 2020-04-30 DIAGNOSIS — R3914 Feeling of incomplete bladder emptying: Secondary | ICD-10-CM | POA: Diagnosis not present

## 2020-04-30 DIAGNOSIS — N281 Cyst of kidney, acquired: Secondary | ICD-10-CM | POA: Diagnosis not present

## 2020-06-20 DIAGNOSIS — L7211 Pilar cyst: Secondary | ICD-10-CM | POA: Diagnosis not present

## 2020-06-20 DIAGNOSIS — L718 Other rosacea: Secondary | ICD-10-CM | POA: Diagnosis not present

## 2020-10-17 DIAGNOSIS — Z0000 Encounter for general adult medical examination without abnormal findings: Secondary | ICD-10-CM | POA: Diagnosis not present

## 2020-10-17 DIAGNOSIS — Z79899 Other long term (current) drug therapy: Secondary | ICD-10-CM | POA: Diagnosis not present

## 2020-10-17 DIAGNOSIS — E559 Vitamin D deficiency, unspecified: Secondary | ICD-10-CM | POA: Diagnosis not present

## 2020-10-17 DIAGNOSIS — E7800 Pure hypercholesterolemia, unspecified: Secondary | ICD-10-CM | POA: Diagnosis not present

## 2020-10-17 DIAGNOSIS — I10 Essential (primary) hypertension: Secondary | ICD-10-CM | POA: Diagnosis not present

## 2020-10-24 ENCOUNTER — Other Ambulatory Visit: Payer: Self-pay | Admitting: Internal Medicine

## 2020-10-24 DIAGNOSIS — E559 Vitamin D deficiency, unspecified: Secondary | ICD-10-CM | POA: Diagnosis not present

## 2020-10-24 DIAGNOSIS — R911 Solitary pulmonary nodule: Secondary | ICD-10-CM | POA: Diagnosis not present

## 2020-10-24 DIAGNOSIS — Z0000 Encounter for general adult medical examination without abnormal findings: Secondary | ICD-10-CM | POA: Diagnosis not present

## 2020-10-24 DIAGNOSIS — I10 Essential (primary) hypertension: Secondary | ICD-10-CM | POA: Diagnosis not present

## 2020-10-24 DIAGNOSIS — E7800 Pure hypercholesterolemia, unspecified: Secondary | ICD-10-CM | POA: Diagnosis not present

## 2020-11-06 ENCOUNTER — Ambulatory Visit
Admission: RE | Admit: 2020-11-06 | Discharge: 2020-11-06 | Disposition: A | Discharge status: Home / Self Care | Payer: Medicare PPO | Source: Ambulatory Visit | Attending: Internal Medicine | Admitting: Internal Medicine

## 2020-11-06 DIAGNOSIS — R911 Solitary pulmonary nodule: Secondary | ICD-10-CM

## 2020-11-06 DIAGNOSIS — R918 Other nonspecific abnormal finding of lung field: Secondary | ICD-10-CM | POA: Diagnosis not present

## 2020-12-30 DIAGNOSIS — H903 Sensorineural hearing loss, bilateral: Secondary | ICD-10-CM | POA: Diagnosis not present

## 2021-01-23 DIAGNOSIS — R3914 Feeling of incomplete bladder emptying: Secondary | ICD-10-CM | POA: Diagnosis not present

## 2021-01-23 DIAGNOSIS — R310 Gross hematuria: Secondary | ICD-10-CM | POA: Diagnosis not present

## 2021-06-09 DIAGNOSIS — D23112 Other benign neoplasm of skin of right lower eyelid, including canthus: Secondary | ICD-10-CM | POA: Diagnosis not present

## 2021-06-09 DIAGNOSIS — H52203 Unspecified astigmatism, bilateral: Secondary | ICD-10-CM | POA: Diagnosis not present

## 2021-06-09 DIAGNOSIS — H2513 Age-related nuclear cataract, bilateral: Secondary | ICD-10-CM | POA: Diagnosis not present

## 2021-06-09 DIAGNOSIS — H1789 Other corneal scars and opacities: Secondary | ICD-10-CM | POA: Diagnosis not present

## 2021-10-23 DIAGNOSIS — E559 Vitamin D deficiency, unspecified: Secondary | ICD-10-CM | POA: Diagnosis not present

## 2021-10-23 DIAGNOSIS — E7800 Pure hypercholesterolemia, unspecified: Secondary | ICD-10-CM | POA: Diagnosis not present

## 2021-10-23 DIAGNOSIS — Z0000 Encounter for general adult medical examination without abnormal findings: Secondary | ICD-10-CM | POA: Diagnosis not present

## 2021-10-28 DIAGNOSIS — Z0000 Encounter for general adult medical examination without abnormal findings: Secondary | ICD-10-CM | POA: Diagnosis not present

## 2021-10-28 DIAGNOSIS — R918 Other nonspecific abnormal finding of lung field: Secondary | ICD-10-CM | POA: Diagnosis not present

## 2021-10-28 DIAGNOSIS — I700 Atherosclerosis of aorta: Secondary | ICD-10-CM | POA: Diagnosis not present

## 2021-10-28 DIAGNOSIS — M79675 Pain in left toe(s): Secondary | ICD-10-CM | POA: Diagnosis not present

## 2021-10-28 DIAGNOSIS — I10 Essential (primary) hypertension: Secondary | ICD-10-CM | POA: Diagnosis not present

## 2021-10-28 DIAGNOSIS — E559 Vitamin D deficiency, unspecified: Secondary | ICD-10-CM | POA: Diagnosis not present

## 2021-11-05 DIAGNOSIS — L84 Corns and callosities: Secondary | ICD-10-CM | POA: Diagnosis not present

## 2021-11-05 DIAGNOSIS — Q66222 Congenital metatarsus adductus, left foot: Secondary | ICD-10-CM | POA: Diagnosis not present

## 2021-11-05 DIAGNOSIS — M2012 Hallux valgus (acquired), left foot: Secondary | ICD-10-CM | POA: Diagnosis not present

## 2021-11-05 DIAGNOSIS — M792 Neuralgia and neuritis, unspecified: Secondary | ICD-10-CM | POA: Diagnosis not present

## 2021-11-05 DIAGNOSIS — I739 Peripheral vascular disease, unspecified: Secondary | ICD-10-CM | POA: Diagnosis not present

## 2021-11-05 DIAGNOSIS — M2042 Other hammer toe(s) (acquired), left foot: Secondary | ICD-10-CM | POA: Diagnosis not present

## 2022-01-16 DIAGNOSIS — I739 Peripheral vascular disease, unspecified: Secondary | ICD-10-CM | POA: Diagnosis not present

## 2022-01-16 DIAGNOSIS — M2012 Hallux valgus (acquired), left foot: Secondary | ICD-10-CM | POA: Diagnosis not present

## 2022-01-16 DIAGNOSIS — M792 Neuralgia and neuritis, unspecified: Secondary | ICD-10-CM | POA: Diagnosis not present

## 2022-01-16 DIAGNOSIS — L84 Corns and callosities: Secondary | ICD-10-CM | POA: Diagnosis not present

## 2022-01-16 DIAGNOSIS — Q66222 Congenital metatarsus adductus, left foot: Secondary | ICD-10-CM | POA: Diagnosis not present

## 2022-01-16 DIAGNOSIS — M2042 Other hammer toe(s) (acquired), left foot: Secondary | ICD-10-CM | POA: Diagnosis not present

## 2022-01-26 DIAGNOSIS — H903 Sensorineural hearing loss, bilateral: Secondary | ICD-10-CM | POA: Diagnosis not present

## 2022-01-27 DIAGNOSIS — H903 Sensorineural hearing loss, bilateral: Secondary | ICD-10-CM | POA: Diagnosis not present

## 2022-02-08 IMAGING — US US ABDOMEN COMPLETE
1 series · 13 of 25 positions shown · non-contrast
Comparison: Noncontrast chest CT 09/15/2017

CLINICAL DATA: Right upper quadrant pain one week with
constipation.

EXAM:
ABDOMEN ULTRASOUND COMPLETE

[Series 1: us abdomen complete · 0.15mm/px · 13 of 89 slices shown]
[im 1/89]
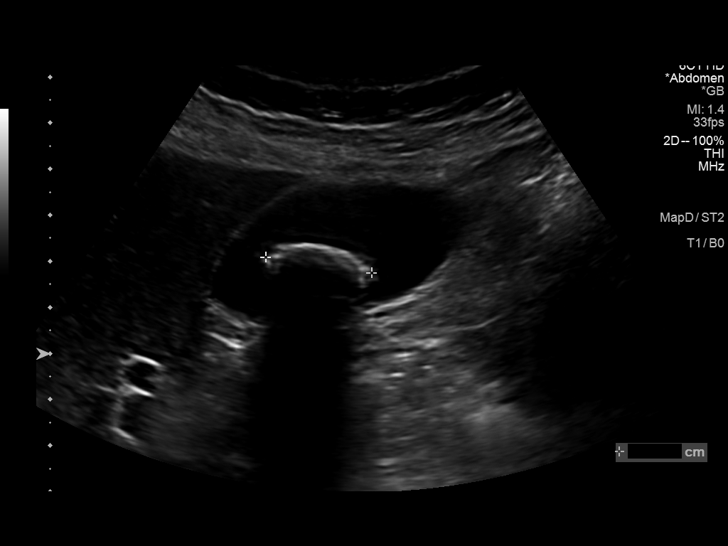
[im 8/89]
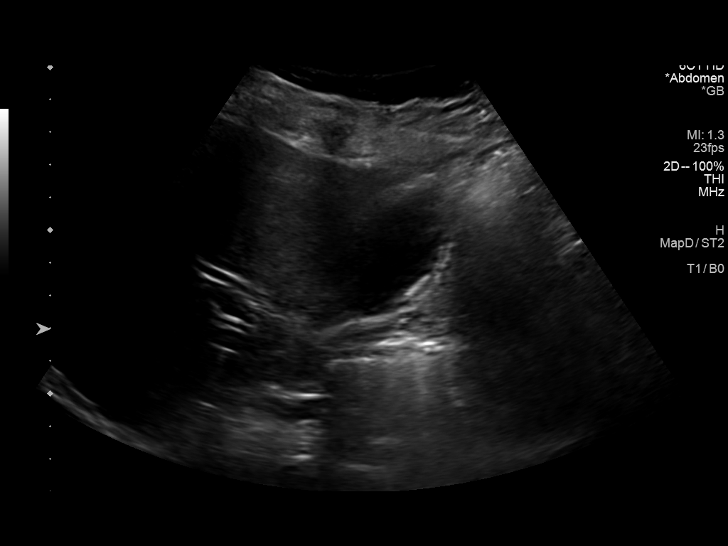
[im 15/89]
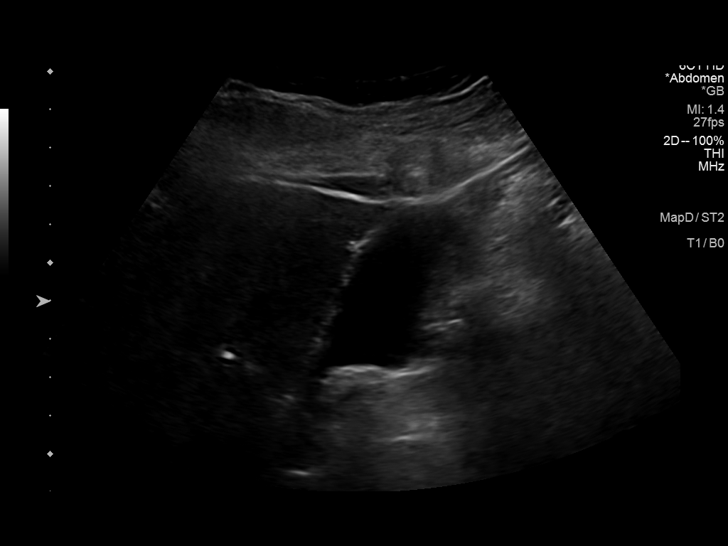
[im 23/89]
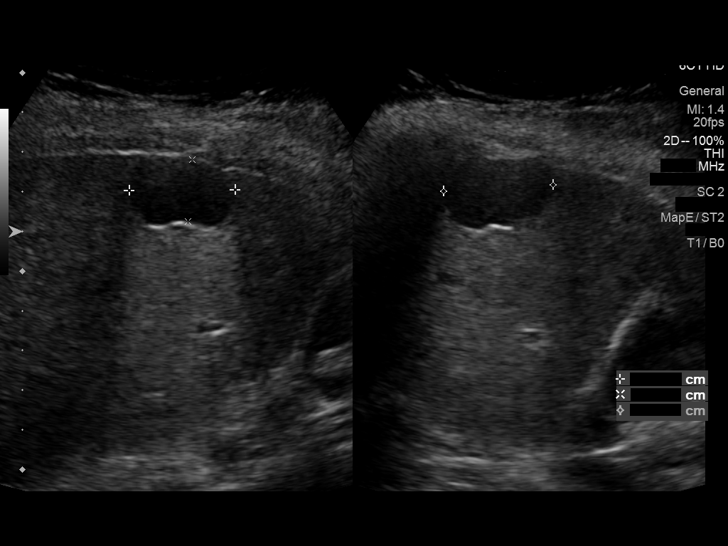
[im 30/89]
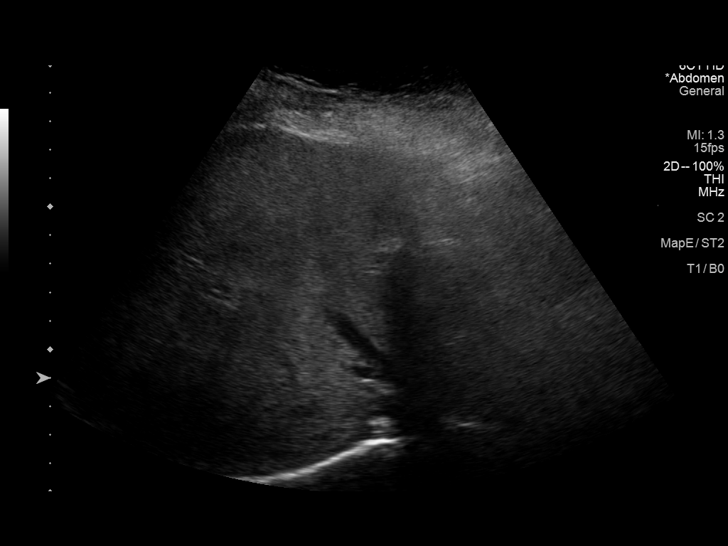
[im 37/89]
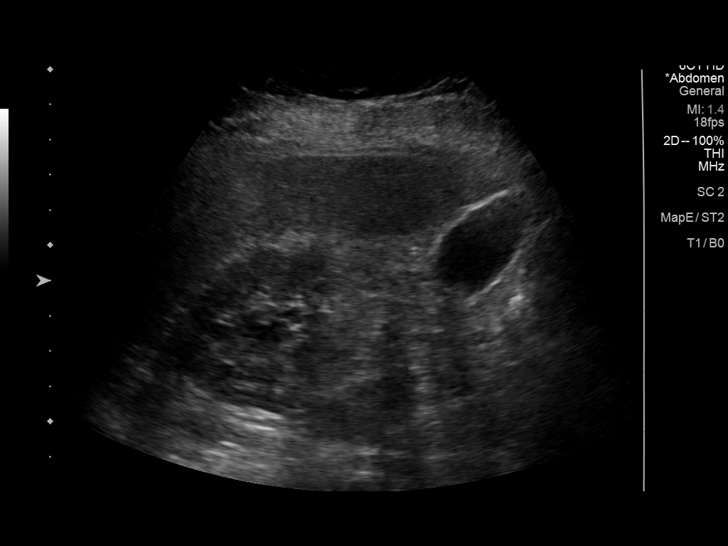
[im 45/89]
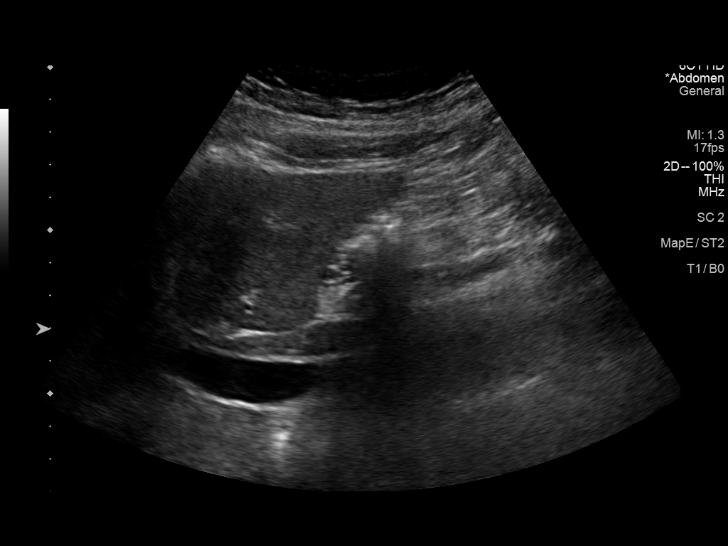
[im 52/89]
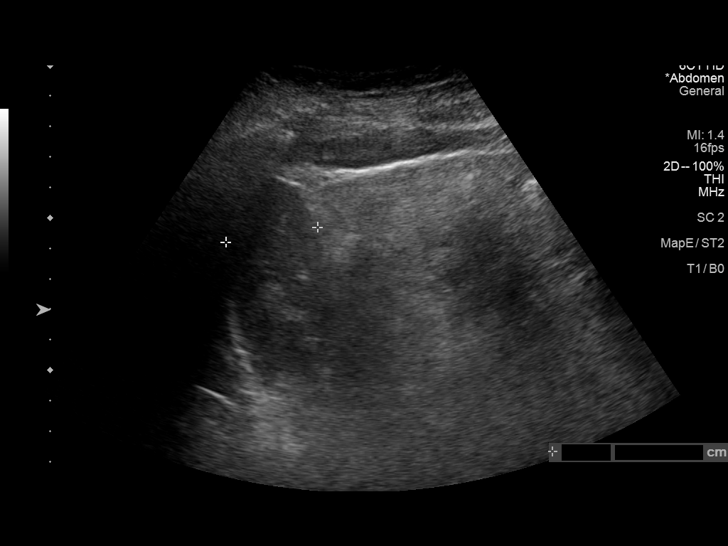
[im 59/89]
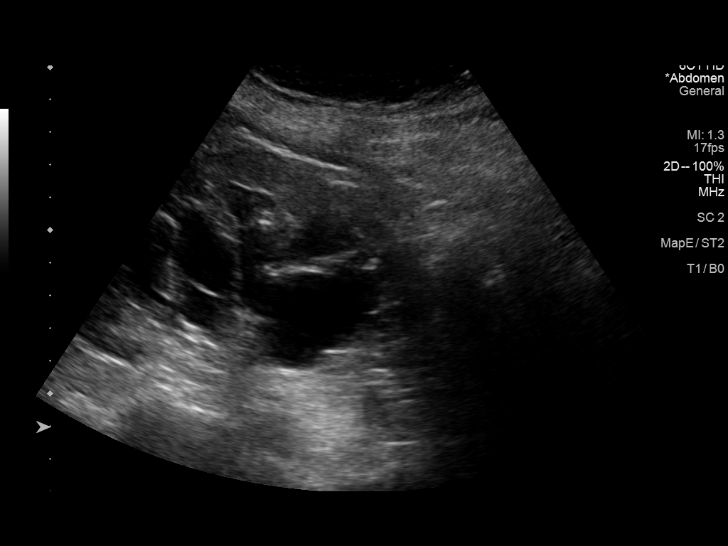
[im 67/89]
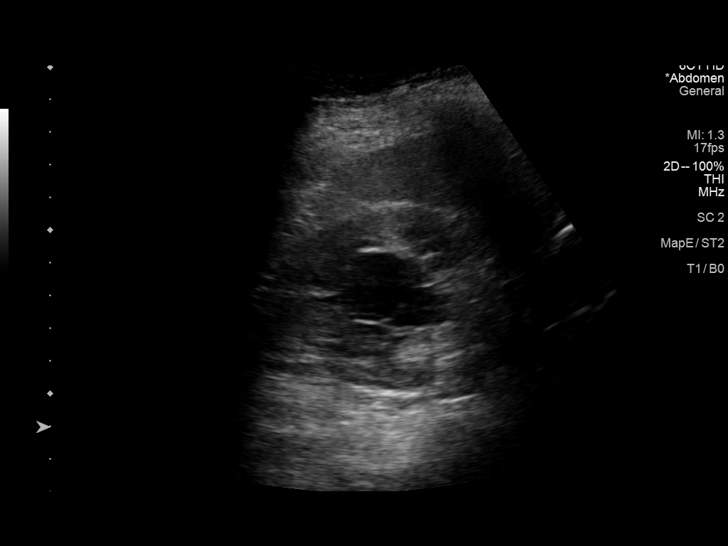
[im 74/89]
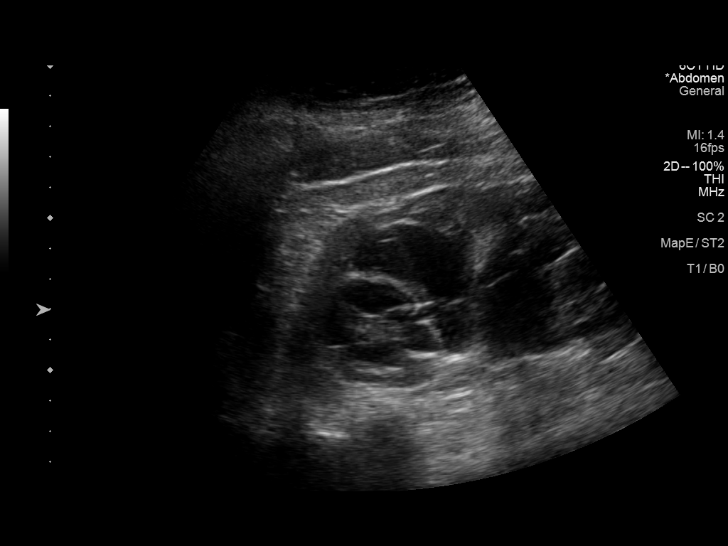
[im 81/89]
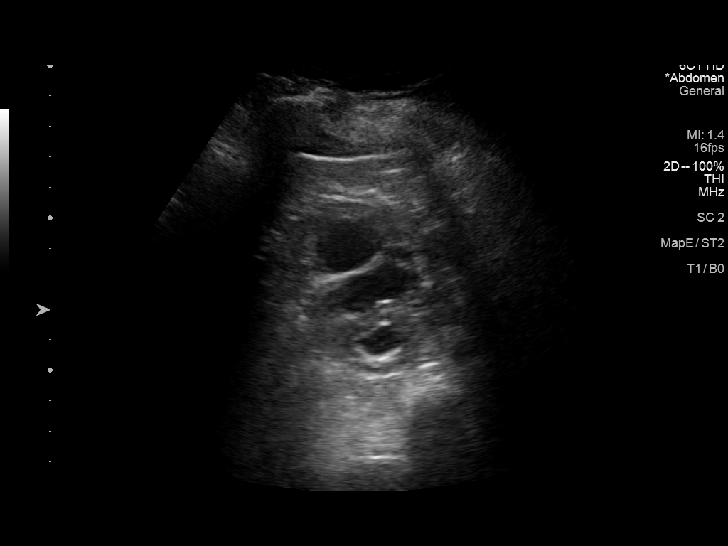
[im 89/89]
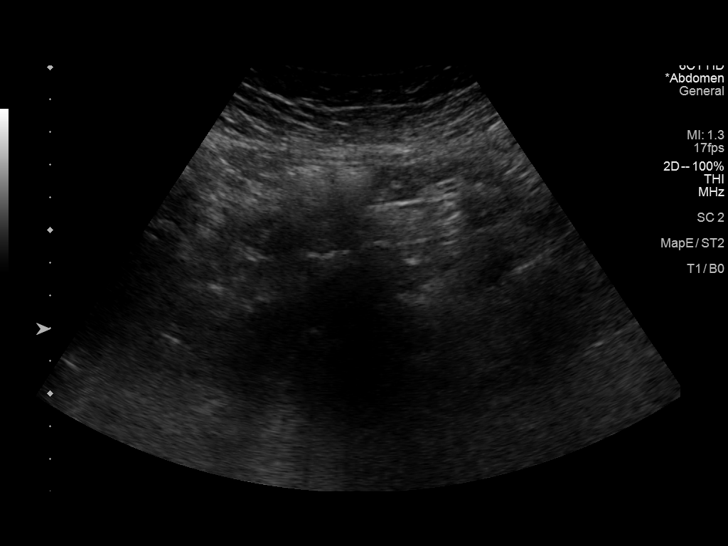

[13 of 25 positions shown; findings below may reference images not displayed]

FINDINGS: Gallbladder: Evidence of mild cholelithiasis/sludge with mobile
cm gallstone present. No evidence of gallbladder wall thickening. No
adjacent pericholecystic fluid. Negative sonographic Murphy sign.

Common bile duct: Diameter: 3.0 mm.

Liver: Mild hepatic steatosis. 2.8 cm cyst over the right lobe.
Portal vein is patent on color Doppler imaging with normal direction
of blood flow towards the liver.

IVC: No abnormality visualized.

Pancreas: Visualized portion unremarkable.

Spleen: Size and appearance within normal limits.

Right Kidney: Length: 12.5 cm. Echogenicity within normal limits. No
mass or hydronephrosis visualized. Findings likely representing
multiple parapelvic cysts and not hydronephrosis. Largest cyst
measures approximately 4 cm. These changes were present, but not
accurately evaluated on the previous noncontrast chest CT scan.

Left Kidney: Length: 12.1 cm. Echogenicity within normal limits. No
mass or hydronephrosis visualized. Findings likely representing
multiple parapelvic cysts and not hydronephrosis. Largest cyst
measures approximately 5.2 cm. These changes were present but not
well evaluated on previous noncontrast chest CT.

Abdominal aorta: No aneurysm visualized.

Other findings: None.
IMPRESSION: 1. Evidence of mild cholelithiasis and sludge with large mobile
stone measuring 2.3 cm. No additional sonographic evidence to
suggest acute cholecystitis.

2. Normal size kidneys with findings suggesting multiple bilateral
parapelvic cysts.

3.  Mild hepatic steatosis.  2.8 cm cyst over the right lobe.

## 2022-03-12 DIAGNOSIS — N281 Cyst of kidney, acquired: Secondary | ICD-10-CM | POA: Diagnosis not present

## 2022-03-12 DIAGNOSIS — R3914 Feeling of incomplete bladder emptying: Secondary | ICD-10-CM | POA: Diagnosis not present

## 2022-03-16 DIAGNOSIS — L820 Inflamed seborrheic keratosis: Secondary | ICD-10-CM | POA: Diagnosis not present

## 2022-03-16 DIAGNOSIS — L718 Other rosacea: Secondary | ICD-10-CM | POA: Diagnosis not present

## 2022-03-16 DIAGNOSIS — L821 Other seborrheic keratosis: Secondary | ICD-10-CM | POA: Diagnosis not present

## 2022-04-08 DIAGNOSIS — Z1231 Encounter for screening mammogram for malignant neoplasm of breast: Secondary | ICD-10-CM | POA: Diagnosis not present

## 2022-04-14 DIAGNOSIS — R922 Inconclusive mammogram: Secondary | ICD-10-CM | POA: Diagnosis not present

## 2022-04-29 DIAGNOSIS — J309 Allergic rhinitis, unspecified: Secondary | ICD-10-CM | POA: Diagnosis not present

## 2022-04-29 DIAGNOSIS — I700 Atherosclerosis of aorta: Secondary | ICD-10-CM | POA: Diagnosis not present

## 2022-04-29 DIAGNOSIS — R918 Other nonspecific abnormal finding of lung field: Secondary | ICD-10-CM | POA: Diagnosis not present

## 2022-04-29 DIAGNOSIS — R5381 Other malaise: Secondary | ICD-10-CM | POA: Diagnosis not present

## 2022-04-29 DIAGNOSIS — I10 Essential (primary) hypertension: Secondary | ICD-10-CM | POA: Diagnosis not present

## 2022-04-29 DIAGNOSIS — E559 Vitamin D deficiency, unspecified: Secondary | ICD-10-CM | POA: Diagnosis not present

## 2022-05-13 ENCOUNTER — Other Ambulatory Visit: Payer: Self-pay

## 2022-05-13 DIAGNOSIS — I10 Essential (primary) hypertension: Secondary | ICD-10-CM

## 2022-05-13 DIAGNOSIS — R911 Solitary pulmonary nodule: Secondary | ICD-10-CM

## 2022-05-19 DIAGNOSIS — N3941 Urge incontinence: Secondary | ICD-10-CM | POA: Diagnosis not present

## 2022-05-19 DIAGNOSIS — R278 Other lack of coordination: Secondary | ICD-10-CM | POA: Diagnosis not present

## 2022-05-19 DIAGNOSIS — M6281 Muscle weakness (generalized): Secondary | ICD-10-CM | POA: Diagnosis not present

## 2022-05-25 DIAGNOSIS — M6281 Muscle weakness (generalized): Secondary | ICD-10-CM | POA: Diagnosis not present

## 2022-05-25 DIAGNOSIS — R278 Other lack of coordination: Secondary | ICD-10-CM | POA: Diagnosis not present

## 2022-05-25 DIAGNOSIS — N3941 Urge incontinence: Secondary | ICD-10-CM | POA: Diagnosis not present

## 2022-05-28 DIAGNOSIS — R278 Other lack of coordination: Secondary | ICD-10-CM | POA: Diagnosis not present

## 2022-05-28 DIAGNOSIS — M6281 Muscle weakness (generalized): Secondary | ICD-10-CM | POA: Diagnosis not present

## 2022-05-28 DIAGNOSIS — N3941 Urge incontinence: Secondary | ICD-10-CM | POA: Diagnosis not present

## 2022-05-29 ENCOUNTER — Other Ambulatory Visit: Payer: Medicare PPO

## 2022-06-01 DIAGNOSIS — M6281 Muscle weakness (generalized): Secondary | ICD-10-CM | POA: Diagnosis not present

## 2022-06-01 DIAGNOSIS — R278 Other lack of coordination: Secondary | ICD-10-CM | POA: Diagnosis not present

## 2022-06-01 DIAGNOSIS — N3941 Urge incontinence: Secondary | ICD-10-CM | POA: Diagnosis not present

## 2022-06-04 DIAGNOSIS — N3941 Urge incontinence: Secondary | ICD-10-CM | POA: Diagnosis not present

## 2022-06-04 DIAGNOSIS — M6281 Muscle weakness (generalized): Secondary | ICD-10-CM | POA: Diagnosis not present

## 2022-06-04 DIAGNOSIS — R278 Other lack of coordination: Secondary | ICD-10-CM | POA: Diagnosis not present

## 2022-06-08 DIAGNOSIS — M6281 Muscle weakness (generalized): Secondary | ICD-10-CM | POA: Diagnosis not present

## 2022-06-08 DIAGNOSIS — N3941 Urge incontinence: Secondary | ICD-10-CM | POA: Diagnosis not present

## 2022-06-08 DIAGNOSIS — R278 Other lack of coordination: Secondary | ICD-10-CM | POA: Diagnosis not present

## 2022-06-11 DIAGNOSIS — R278 Other lack of coordination: Secondary | ICD-10-CM | POA: Diagnosis not present

## 2022-06-11 DIAGNOSIS — N3941 Urge incontinence: Secondary | ICD-10-CM | POA: Diagnosis not present

## 2022-06-11 DIAGNOSIS — M6281 Muscle weakness (generalized): Secondary | ICD-10-CM | POA: Diagnosis not present

## 2022-06-11 DIAGNOSIS — H5203 Hypermetropia, bilateral: Secondary | ICD-10-CM | POA: Diagnosis not present

## 2022-06-11 DIAGNOSIS — H02834 Dermatochalasis of left upper eyelid: Secondary | ICD-10-CM | POA: Diagnosis not present

## 2022-06-11 DIAGNOSIS — H52203 Unspecified astigmatism, bilateral: Secondary | ICD-10-CM | POA: Diagnosis not present

## 2022-06-11 DIAGNOSIS — H2513 Age-related nuclear cataract, bilateral: Secondary | ICD-10-CM | POA: Diagnosis not present

## 2022-06-11 DIAGNOSIS — H02831 Dermatochalasis of right upper eyelid: Secondary | ICD-10-CM | POA: Diagnosis not present

## 2022-06-15 DIAGNOSIS — R278 Other lack of coordination: Secondary | ICD-10-CM | POA: Diagnosis not present

## 2022-06-15 DIAGNOSIS — N3941 Urge incontinence: Secondary | ICD-10-CM | POA: Diagnosis not present

## 2022-06-15 DIAGNOSIS — M6281 Muscle weakness (generalized): Secondary | ICD-10-CM | POA: Diagnosis not present

## 2022-06-22 ENCOUNTER — Ambulatory Visit
Admission: RE | Admit: 2022-06-22 | Discharge: 2022-06-22 | Disposition: A | Discharge status: Home / Self Care | Payer: Medicare PPO | Source: Ambulatory Visit

## 2022-06-22 DIAGNOSIS — R278 Other lack of coordination: Secondary | ICD-10-CM | POA: Diagnosis not present

## 2022-06-22 DIAGNOSIS — N3941 Urge incontinence: Secondary | ICD-10-CM | POA: Diagnosis not present

## 2022-06-22 DIAGNOSIS — R918 Other nonspecific abnormal finding of lung field: Secondary | ICD-10-CM | POA: Diagnosis not present

## 2022-06-22 DIAGNOSIS — R911 Solitary pulmonary nodule: Secondary | ICD-10-CM

## 2022-06-22 DIAGNOSIS — I10 Essential (primary) hypertension: Secondary | ICD-10-CM

## 2022-06-22 DIAGNOSIS — M6281 Muscle weakness (generalized): Secondary | ICD-10-CM | POA: Diagnosis not present

## 2022-06-22 DIAGNOSIS — I700 Atherosclerosis of aorta: Secondary | ICD-10-CM | POA: Diagnosis not present

## 2022-06-26 DIAGNOSIS — R278 Other lack of coordination: Secondary | ICD-10-CM | POA: Diagnosis not present

## 2022-06-26 DIAGNOSIS — N3941 Urge incontinence: Secondary | ICD-10-CM | POA: Diagnosis not present

## 2022-06-26 DIAGNOSIS — M6281 Muscle weakness (generalized): Secondary | ICD-10-CM | POA: Diagnosis not present

## 2022-07-03 DIAGNOSIS — N3941 Urge incontinence: Secondary | ICD-10-CM | POA: Diagnosis not present

## 2022-07-03 DIAGNOSIS — M6281 Muscle weakness (generalized): Secondary | ICD-10-CM | POA: Diagnosis not present

## 2022-07-03 DIAGNOSIS — R278 Other lack of coordination: Secondary | ICD-10-CM | POA: Diagnosis not present

## 2022-07-06 DIAGNOSIS — N3941 Urge incontinence: Secondary | ICD-10-CM | POA: Diagnosis not present

## 2022-07-06 DIAGNOSIS — R278 Other lack of coordination: Secondary | ICD-10-CM | POA: Diagnosis not present

## 2022-07-06 DIAGNOSIS — M6281 Muscle weakness (generalized): Secondary | ICD-10-CM | POA: Diagnosis not present

## 2022-07-10 DIAGNOSIS — R509 Fever, unspecified: Secondary | ICD-10-CM | POA: Diagnosis not present

## 2022-07-10 DIAGNOSIS — R051 Acute cough: Secondary | ICD-10-CM | POA: Diagnosis not present

## 2022-07-10 DIAGNOSIS — B9689 Other specified bacterial agents as the cause of diseases classified elsewhere: Secondary | ICD-10-CM | POA: Diagnosis not present

## 2022-07-10 DIAGNOSIS — J019 Acute sinusitis, unspecified: Secondary | ICD-10-CM | POA: Diagnosis not present

## 2022-08-05 DIAGNOSIS — J111 Influenza due to unidentified influenza virus with other respiratory manifestations: Secondary | ICD-10-CM | POA: Diagnosis not present

## 2022-08-12 DIAGNOSIS — R911 Solitary pulmonary nodule: Secondary | ICD-10-CM | POA: Diagnosis not present

## 2022-08-12 DIAGNOSIS — I2510 Atherosclerotic heart disease of native coronary artery without angina pectoris: Secondary | ICD-10-CM | POA: Diagnosis not present

## 2022-08-12 DIAGNOSIS — I700 Atherosclerosis of aorta: Secondary | ICD-10-CM | POA: Diagnosis not present

## 2022-09-02 ENCOUNTER — Encounter: Payer: Self-pay | Admitting: Podiatry

## 2022-09-02 ENCOUNTER — Ambulatory Visit: Payer: Medicare PPO | Admitting: Podiatry

## 2022-09-02 VITALS — BP 128/70

## 2022-09-02 DIAGNOSIS — L84 Corns and callosities: Secondary | ICD-10-CM | POA: Diagnosis not present

## 2022-09-02 DIAGNOSIS — I2510 Atherosclerotic heart disease of native coronary artery without angina pectoris: Secondary | ICD-10-CM | POA: Insufficient documentation

## 2022-09-02 NOTE — Progress Notes (Signed)
Subjective:  Patient ID: Stacie Gray, female    DOB: 01-08-1934,  MRN: [id number]  Chief Complaint  Patient presents with   Toe Pain    Place between 4th and 5th toe causing pain     89 y.o. female presents with the above complaint.  Patient presents with complaint left fourth and fifth heloma molle between the toes.  Patient states pain for touch is progressive gotten worse.  She has increased shoe size and made shoe gear modification has not helped.  She does tend to wear more point to her shoes.  She wanted to get it evaluated she has not been seen by anyone else prior to seeing me.  Pain scale is 5 out of 10 dull achy in nature.  She wanted to focus more on conservative care.   Review of Systems: Negative except as noted in the HPI. Denies N/V/F/Ch.  Past Medical History:  Diagnosis Date   Anxiety    DCIS (ductal carcinoma in situ)    Osteoporosis    Pericarditis     Current Outpatient Medications:    benzonatate (TESSALON) 200 MG capsule, Take by mouth., Disp: , Rfl:    cetirizine (ZYRTEC ALLERGY) 10 MG tablet, 1 tablet Orally Once a day prn, Disp: , Rfl:    doxycycline (VIBRAMYCIN) 100 MG capsule, 1 capsule Orally Twice a day, Disp: , Rfl:    metroNIDAZOLE (METROCREAM) 0.75 % cream, SMARTSIG:Sparingly Topical Daily, Disp: , Rfl:    oseltamivir (TAMIFLU) 75 MG capsule, Take 75 mg by mouth 2 (two) times daily., Disp: , Rfl:    predniSONE (DELTASONE) 20 MG tablet, Take 40 mg by mouth daily., Disp: , Rfl:    rosuvastatin (CRESTOR) 5 MG tablet, 1 tablet Orally Once a day for 30 days, Disp: , Rfl:    Azelastine HCl 137 MCG/SPRAY SOLN, Place into both nostrils., Disp: , Rfl:    bethanechol (URECHOLINE) 25 MG tablet, 1 tablet 1 hour before or 2 hours after meals Orally Three times a day for 30 day(s), Disp: , Rfl:    Cholecalciferol (VITAMIN D3 PO), Take 500 mg by mouth daily., Disp: , Rfl:    clonazePAM (KLONOPIN) 0.5 MG tablet, Take 0.5 mg by mouth at bedtime as needed.,  Disp: , Rfl: 4   losartan (COZAAR) 50 MG tablet, Take 50 mg by mouth daily., Disp: , Rfl: 5   Magnesium 500 MG TABS, Take 500 mg by mouth daily., Disp: , Rfl:   Social History   Tobacco Use  Smoking Status Never  Smokeless Tobacco Never    Allergies  Allergen Reactions   Codeine     Other Reaction(s): dizzy   Methocarbamol Nausea Only   Objective:   Vitals:   09/02/22 1346  BP: 128/70   There is no height or weight on file to calculate BMI. Constitutional Well developed. Well nourished.  Vascular Dorsalis pedis pulses palpable bilaterally. Posterior tibial pulses palpable bilaterally. Capillary refill normal to all digits.  No cyanosis or clubbing noted. Pedal hair growth normal.  Neurologic Normal speech. Oriented to person, place, and time. Epicritic sensation to light touch grossly present bilaterally.  Dermatologic Left fourth and fifth digit heloma molle noted in the interdigital space.  Pain on palpation to the lesion.  Hammertoe contracture of the fourth and fifth digit noted.  No other abnormalities noted no open wounds or lesion noted  Orthopedic: Normal joint ROM without pain or crepitus bilaterally. No visible deformities. No bony tenderness.   Radiographs: None Assessment:  1. Heloma molle    Plan:  Patient was evaluated and treated and all questions answered.  Left fourth and fifth digit heloma molle -All questions and concerns were discussed with the patient in extensive detail.  I discussed shoe gear modification extensive detail I discussed spacers spreading the toes that can take the pressure away.  She states understand will do so immediately. -Toe spacers and protectors were dispensed  No follow-ups on file.

## 2022-10-09 ENCOUNTER — Ambulatory Visit: Payer: Medicare PPO | Attending: Internal Medicine | Admitting: Internal Medicine

## 2022-10-09 ENCOUNTER — Encounter: Payer: Self-pay | Admitting: Internal Medicine

## 2022-10-09 VITALS — BP 132/84 | HR 67 | Ht 65.0 in | Wt 144.6 lb

## 2022-10-09 DIAGNOSIS — I2510 Atherosclerotic heart disease of native coronary artery without angina pectoris: Secondary | ICD-10-CM

## 2022-10-09 DIAGNOSIS — E785 Hyperlipidemia, unspecified: Secondary | ICD-10-CM | POA: Diagnosis not present

## 2022-10-09 NOTE — Patient Instructions (Signed)
Medication Instructions:  No Changes In Medications at this time.  *If you need a refill on your cardiac medications before your next appointment, please call your pharmacy*  Lab Work:  Please return for FASTING Blood Work NEXT AVAILABLE . No appointment needed, lab here at the office is open Monday-Friday from 8AM to 4PM and closed daily for lunch from 12:45-1:45.   If you have labs (blood work) drawn today and your tests are completely normal, you will receive your results only by: [place] (if you have MyChart) OR A paper copy in the mail If you have any lab test that is abnormal or we need to change your treatment, we will call you to review the results.  Testing/Procedures: None Ordered At This Time.   Follow-Up: At [facility], you and your health needs are our priority.  As part of our continuing mission to provide you with exceptional heart care, we have created designated Provider Care Teams.  These Care Teams include your primary Cardiologist (physician) and Advanced Practice Providers (APPs -  Physician Assistants and Nurse Practitioners) who all work together to provide you with the care you need, when you need it.  Your next appointment:   1 year(s)  Provider:   Pixie Casino, MD

## 2022-10-09 NOTE — Progress Notes (Signed)
OFFICE NOTE  Chief Complaint:  Reestablish cardiologist, coronary artery calcification  Primary Care Physician: Holland Commons, FNP  HPI:  Stacie Gray is a 89 y.o. female with a past medical history significant for remote pericarditis in the setting of fever however she did not have any chest pain in 1996, DCIS status post lumpectomy in 2002, anxiety and osteoarthritis. Recently she's been having some more shortness of breath which she feels may be related to anxiety. She's been caring for her husband who is COPD. She's had several episodes where she felt like she was anxious and her heart was racing however the anxiety likely preceded the tachycardia. She denies any chest pain but does get short of breath when doing some activities. There is no significant family history of coronary disease and she has few cardiac risk factors other than age. There was a remote history of possible pericarditis in the 1990s where she had fever but did not have chest discomfort. She had an echo at that time, but those results are not immediately available. EKG was performed in the office today which was abnormal showing sinus rhythm at 62, possible anterior infarct pattern and T-wave abnormality laterally concerning for ischemia.  07/28/2016  Stacie Gray returns today for follow-up. I'm pleased to report her nuclear stress test was negative for ischemia. LV function is stable. Her echo shows normal LV systolic function with mild diastolic dysfunction and trivial aortic insufficiency. This does not necessarily explain her shortness of breath or chest discomfort. She is under significant amount of stress however caring for her husband and I think that's also contributing to her issues. Blood pressure notably was elevated previously 152/76. Today it's elevated 149/80. I suspect the hypertension could be causing some of her symptoms. She does report some tension over her temples which may occur in the  afternoons and could represent a manifestation of her hypertension.  11/07/2022  Stacie Gray is seen today to reestablish cardiology.  She is considered a new patient having seen me last in 2017.  She was referred back for evaluation of coronary artery calcification seen on recent CT scan.  When I last saw her and 2017 she underwent an echocardiogram and stress test which showed no evidence of ischemia and normal LV function.  She had no known coronary disease but recently had to pulmonary infections and underwent a CT scan of the chest which indicated some coronary and aortic atherosclerosis.  Her last lipid profile in March 2023 showed total cholesterol 195, HDL 44, triglycerides 168 and LDL 126.  Her chart indicates an order for 5 mg of rosuvastatin but she notes that she does not have this medicine nor is she taking it.  She is asymptomatic, denying any chest pain or shortness of breath with exertion.  PMHx:  Past Medical History:  Diagnosis Date   Anxiety    DCIS (ductal carcinoma in situ)    Osteoporosis    Pericarditis     Past Surgical History:  Procedure Laterality Date   BREAST LUMPECTOMY      FAMHx:  Family History  Problem Relation Age of Onset   Heart disease Paternal Grandmother    Stroke Paternal Grandfather     SOCHx:   reports that she has never smoked. She has never used smokeless tobacco. She reports that she does not drink alcohol and does not use drugs.  ALLERGIES:  Allergies  Allergen Reactions   Codeine     Other Reaction(s): dizzy   Methocarbamol Nausea  Only    ROS: Pertinent items noted in HPI and remainder of comprehensive ROS otherwise negative.  HOME MEDS: Current Outpatient Medications on File Prior to Visit  Medication Sig Dispense Refill   bethanechol (URECHOLINE) 25 MG tablet 1 tablet 1 hour before or 2 hours after meals Orally Three times a day for 30 day(s)     cetirizine (ZYRTEC ALLERGY) 10 MG tablet 1 tablet Orally Once a day prn      Cholecalciferol (VITAMIN D3 PO) Take 500 mg by mouth daily.     clonazePAM (KLONOPIN) 0.5 MG tablet Take 0.5 mg by mouth at bedtime as needed.  4   losartan (COZAAR) 50 MG tablet Take 50 mg by mouth daily.  5   Magnesium 500 MG TABS Take 500 mg by mouth daily.     Azelastine HCl 137 MCG/SPRAY SOLN Place into both nostrils. (Patient not taking: Reported on 10/09/2022)     benzonatate (TESSALON) 200 MG capsule Take by mouth. (Patient not taking: Reported on 10/09/2022)     doxycycline (VIBRAMYCIN) 100 MG capsule 1 capsule Orally Twice a day (Patient not taking: Reported on 10/09/2022)     metroNIDAZOLE (METROCREAM) 0.75 % cream SMARTSIG:Sparingly Topical Daily (Patient not taking: Reported on 10/09/2022)     oseltamivir (TAMIFLU) 75 MG capsule Take 75 mg by mouth 2 (two) times daily. (Patient not taking: Reported on 10/09/2022)     predniSONE (DELTASONE) 20 MG tablet Take 40 mg by mouth daily. (Patient not taking: Reported on 10/09/2022)     rosuvastatin (CRESTOR) 5 MG tablet 1 tablet Orally Once a day for 30 days (Patient not taking: Reported on 10/09/2022)     No current facility-administered medications on file prior to visit.    LABS/IMAGING: No results found for this or any previous visit (from the past 48 hour(s)). No results found.  WEIGHTS: Wt Readings from Last 3 Encounters:  10/09/22 144 lb 9.6 oz (65.6 kg)  12/07/17 145 lb (65.8 kg)  11/23/17 145 lb (65.8 kg)    VITALS: BP 132/84   Pulse 67   Ht '5\' 5"'$  (1.651 m)   Wt 144 lb 9.6 oz (65.6 kg)   SpO2 96%   BMI 24.06 kg/m   EXAM: General appearance: alert and no distress Neck: no carotid bruit, no JVD, and thyroid not enlarged, symmetric, no tenderness/mass/nodules Lungs: clear to auscultation bilaterally Heart: regular rate and rhythm, S1, S2 normal, no murmur, click, rub or gallop Abdomen: soft, non-tender; bowel sounds normal; no masses,  no organomegaly Extremities: extremities normal, atraumatic, no cyanosis or  edema Pulses: 2+ and symmetric Skin: Skin color, texture, turgor normal. No rashes or lesions Neurologic: Grossly normal Psych: Pleasant  EKG: Normal sinus rhythm at 67-personally reviewed  ASSESSMENT: CT evidence of coronary artery calcification/aortic atherosclerosis Progressive dyspnea on exertion - low risk Myoview and normal LV EF on echo (06/2016) Abnormal EKG with ischemic changes Anxiety Remote history of pericarditis Hypertension  PLAN: 1.   Stacie Gray was referred back for findings of coronary calcification/aortic atherosclerosis on CT scan.  This was not quantified but at her age of 23 is not terribly unusual.  She is asymptomatic.  Her cholesterol could be better controlled.  If that has not changed I would agree with a repeat lipid profile and likely start her 5 mg of rosuvastatin that was previously suggested for her.  Will then repeat lipids in about 3 to 4 months and follow-up with her at that time.  She is agreeable to trying this.  Thanks again for referring her back.  Pixie Casino, MD, [facility], [place] Director of the [facility]  [facility] Diplomate of the American Board of Clinical Lipidology Attending Cardiologist  Direct Dial: [phone]  Fax: [phone]  Website:  [email or web address] [email or web address] Bart 10/09/2022, 4:22 PM

## 2022-10-28 DIAGNOSIS — E559 Vitamin D deficiency, unspecified: Secondary | ICD-10-CM | POA: Diagnosis not present

## 2022-10-28 DIAGNOSIS — Z0000 Encounter for general adult medical examination without abnormal findings: Secondary | ICD-10-CM | POA: Diagnosis not present

## 2022-10-28 LAB — LAB REPORT - SCANNED: EGFR: 46

## 2022-11-04 DIAGNOSIS — L820 Inflamed seborrheic keratosis: Secondary | ICD-10-CM | POA: Diagnosis not present

## 2022-11-04 DIAGNOSIS — N390 Urinary tract infection, site not specified: Secondary | ICD-10-CM | POA: Diagnosis not present

## 2022-11-04 DIAGNOSIS — I10 Essential (primary) hypertension: Secondary | ICD-10-CM | POA: Diagnosis not present

## 2022-11-04 DIAGNOSIS — I700 Atherosclerosis of aorta: Secondary | ICD-10-CM | POA: Diagnosis not present

## 2022-11-04 DIAGNOSIS — Z0000 Encounter for general adult medical examination without abnormal findings: Secondary | ICD-10-CM | POA: Diagnosis not present

## 2022-11-04 DIAGNOSIS — E559 Vitamin D deficiency, unspecified: Secondary | ICD-10-CM | POA: Diagnosis not present

## 2023-02-05 DIAGNOSIS — H903 Sensorineural hearing loss, bilateral: Secondary | ICD-10-CM | POA: Diagnosis not present

## 2023-03-17 DIAGNOSIS — N281 Cyst of kidney, acquired: Secondary | ICD-10-CM | POA: Diagnosis not present

## 2023-03-17 DIAGNOSIS — R3914 Feeling of incomplete bladder emptying: Secondary | ICD-10-CM | POA: Diagnosis not present

## 2023-04-14 DIAGNOSIS — Z1231 Encounter for screening mammogram for malignant neoplasm of breast: Secondary | ICD-10-CM | POA: Diagnosis not present

## 2023-04-21 ENCOUNTER — Ambulatory Visit (INDEPENDENT_AMBULATORY_CARE_PROVIDER_SITE_OTHER): Payer: Medicare PPO | Admitting: Podiatry

## 2023-04-21 DIAGNOSIS — M7661 Achilles tendinitis, right leg: Secondary | ICD-10-CM

## 2023-04-21 DIAGNOSIS — M9261 Juvenile osteochondrosis of tarsus, right ankle: Secondary | ICD-10-CM | POA: Diagnosis not present

## 2023-04-21 NOTE — Progress Notes (Signed)
Subjective:  Patient ID: Stacie Gray, female    DOB: [birth year],  MRN: [id number]  Chief Complaint  Patient presents with   Foot Pain    89 y.o. female presents with the above complaint.  Patient presents with right Achilles tendinitis insertional pain.  Patient states painful to touch is progressive and worse worse with ambulation worse with pressure she would like to discuss treatment options for this pain scale 7 out of 10 dull aching nature hurts with ambulation worse with pressure   Review of Systems: Negative except as noted in the HPI. Denies N/V/F/Ch.  Past Medical History:  Diagnosis Date   Anxiety    DCIS (ductal carcinoma in situ)    Osteoporosis    Pericarditis     Current Outpatient Medications:    Azelastine HCl 137 MCG/SPRAY SOLN, Place into both nostrils. (Patient not taking: Reported on 10/09/2022), Disp: , Rfl:    benzonatate (TESSALON) 200 MG capsule, Take by mouth. (Patient not taking: Reported on 10/09/2022), Disp: , Rfl:    bethanechol (URECHOLINE) 25 MG tablet, 1 tablet 1 hour before or 2 hours after meals Orally Three times a day for 30 day(s), Disp: , Rfl:    cetirizine (ZYRTEC ALLERGY) 10 MG tablet, 1 tablet Orally Once a day prn, Disp: , Rfl:    Cholecalciferol (VITAMIN D3 PO), Take 500 mg by mouth daily., Disp: , Rfl:    clonazePAM (KLONOPIN) 0.5 MG tablet, Take 0.5 mg by mouth at bedtime as needed., Disp: , Rfl: 4   doxycycline (VIBRAMYCIN) 100 MG capsule, 1 capsule Orally Twice a day (Patient not taking: Reported on 10/09/2022), Disp: , Rfl:    losartan (COZAAR) 50 MG tablet, Take 50 mg by mouth daily., Disp: , Rfl: 5   Magnesium 500 MG TABS, Take 500 mg by mouth daily., Disp: , Rfl:    metroNIDAZOLE (METROCREAM) 0.75 % cream, SMARTSIG:Sparingly Topical Daily (Patient not taking: Reported on 10/09/2022), Disp: , Rfl:    oseltamivir (TAMIFLU) 75 MG capsule, Take 75 mg by mouth 2 (two) times daily. (Patient not taking: Reported on 10/09/2022), Disp: ,  Rfl:    predniSONE (DELTASONE) 20 MG tablet, Take 40 mg by mouth daily. (Patient not taking: Reported on 10/09/2022), Disp: , Rfl:    rosuvastatin (CRESTOR) 5 MG tablet, Take 1 tablet (5 mg total) by mouth daily. Needs labs after the 90 days to continue dosage., Disp: 90 tablet, Rfl: 0  Social History   Tobacco Use  Smoking Status Never  Smokeless Tobacco Never    Allergies  Allergen Reactions   Codeine     Other Reaction(s): dizzy   Methocarbamol Nausea Only   Objective:  There were no vitals filed for this visit. There is no height or weight on file to calculate BMI. Constitutional Well developed. Well nourished.  Vascular Dorsalis pedis pulses palpable bilaterally. Posterior tibial pulses palpable bilaterally. Capillary refill normal to all digits.  No cyanosis or clubbing noted. Pedal hair growth normal.  Neurologic Normal speech. Oriented to person, place, and time. Epicritic sensation to light touch grossly present bilaterally.  Dermatologic Nails well groomed and normal in appearance. No open wounds. No skin lesions.  Orthopedic: Pain on palpation to the right Achilles tendon insertion positive Haglund's deformity.  Positive Silfverskiold test with gastrocnemius equinus.  No pain at the peroneal tendon posterior tibial tendon ATFL ligament   Radiographs: None Assessment:   1. Achilles tendinitis, right leg   2. Haglund's deformity, right    Plan:  Patient was  evaluated and treated and all questions answered.  Right Achilles tendinitis with underlying Haglund's deformity -All questions and concerns were discussed with the patient extensive detail given the amount of pain that she is having she will benefit from cam boot immobilization.  She agrees with the plan Cam boot was dispensed.  If there is no improvement we will discuss steroid injection versus MRI.  She states understanding -Encouraged shoe gear modification -Encourage stretching exercises  No follow-ups  on file.

## 2023-04-27 ENCOUNTER — Telehealth: Payer: Self-pay | Admitting: Internal Medicine

## 2023-04-27 NOTE — Telephone Encounter (Signed)
Patient stated Dr. Rennis Golden prescribed new medication for her and she went on vacation and lost the medication.  Patient does not remember which medication and wants to get a refill.

## 2023-04-27 NOTE — Telephone Encounter (Signed)
Returned call to pt in regards to her lost medication while on vacation. Went straight to VM to return our call.

## 2023-04-28 MED ORDER — ROSUVASTATIN CALCIUM 5 MG PO TABS: 5.0000 mg | ORAL_TABLET | Freq: Every day | ORAL | 0 refills | Status: DC

## 2023-04-28 NOTE — Telephone Encounter (Signed)
Patient ask for Crestor though OV notes state not taking this medication.  She was confused throughout the call . Had a hard time figuring out which medication she needed.  She states "I started it back in July, if I had any.  " She states she wants to continue taking it.  Sent 90 day and asked to have labs done after the 90 day to see if dosage needs to be adjusted. She states  understanding

## 2023-06-15 DIAGNOSIS — H52203 Unspecified astigmatism, bilateral: Secondary | ICD-10-CM | POA: Diagnosis not present

## 2023-06-15 DIAGNOSIS — D23112 Other benign neoplasm of skin of right lower eyelid, including canthus: Secondary | ICD-10-CM | POA: Diagnosis not present

## 2023-06-15 DIAGNOSIS — H2513 Age-related nuclear cataract, bilateral: Secondary | ICD-10-CM | POA: Diagnosis not present

## 2023-07-01 DIAGNOSIS — D23112 Other benign neoplasm of skin of right lower eyelid, including canthus: Secondary | ICD-10-CM | POA: Diagnosis not present

## 2023-08-01 DIAGNOSIS — S61012A Laceration without foreign body of left thumb without damage to nail, initial encounter: Secondary | ICD-10-CM | POA: Diagnosis not present

## 2023-08-02 DIAGNOSIS — G4700 Insomnia, unspecified: Secondary | ICD-10-CM | POA: Diagnosis not present

## 2023-08-02 DIAGNOSIS — M1990 Unspecified osteoarthritis, unspecified site: Secondary | ICD-10-CM | POA: Diagnosis not present

## 2023-08-02 DIAGNOSIS — I739 Peripheral vascular disease, unspecified: Secondary | ICD-10-CM | POA: Diagnosis not present

## 2023-08-02 DIAGNOSIS — J309 Allergic rhinitis, unspecified: Secondary | ICD-10-CM | POA: Diagnosis not present

## 2023-08-02 DIAGNOSIS — M792 Neuralgia and neuritis, unspecified: Secondary | ICD-10-CM | POA: Diagnosis not present

## 2023-08-02 DIAGNOSIS — E785 Hyperlipidemia, unspecified: Secondary | ICD-10-CM | POA: Diagnosis not present

## 2023-08-02 DIAGNOSIS — F419 Anxiety disorder, unspecified: Secondary | ICD-10-CM | POA: Diagnosis not present

## 2023-08-02 DIAGNOSIS — I2510 Atherosclerotic heart disease of native coronary artery without angina pectoris: Secondary | ICD-10-CM | POA: Diagnosis not present

## 2023-08-02 DIAGNOSIS — I129 Hypertensive chronic kidney disease with stage 1 through stage 4 chronic kidney disease, or unspecified chronic kidney disease: Secondary | ICD-10-CM | POA: Diagnosis not present

## 2023-09-07 DIAGNOSIS — M25562 Pain in left knee: Secondary | ICD-10-CM | POA: Diagnosis not present

## 2023-09-07 DIAGNOSIS — M25561 Pain in right knee: Secondary | ICD-10-CM | POA: Diagnosis not present

## 2023-09-07 DIAGNOSIS — R0781 Pleurodynia: Secondary | ICD-10-CM | POA: Diagnosis not present

## 2023-09-07 DIAGNOSIS — W19XXXA Unspecified fall, initial encounter: Secondary | ICD-10-CM | POA: Diagnosis not present

## 2023-10-06 ENCOUNTER — Other Ambulatory Visit: Payer: Self-pay | Admitting: Internal Medicine

## 2023-11-02 ENCOUNTER — Ambulatory Visit (HOSPITAL_BASED_OUTPATIENT_CLINIC_OR_DEPARTMENT_OTHER): Payer: Medicare PPO | Admitting: Internal Medicine

## 2023-11-02 VITALS — BP 148/72 | HR 77 | Ht 65.0 in | Wt 150.8 lb

## 2023-11-02 DIAGNOSIS — I700 Atherosclerosis of aorta: Secondary | ICD-10-CM

## 2023-11-02 DIAGNOSIS — E785 Hyperlipidemia, unspecified: Secondary | ICD-10-CM | POA: Diagnosis not present

## 2023-11-02 DIAGNOSIS — I2510 Atherosclerotic heart disease of native coronary artery without angina pectoris: Secondary | ICD-10-CM

## 2023-11-02 DIAGNOSIS — I10 Essential (primary) hypertension: Secondary | ICD-10-CM | POA: Diagnosis not present

## 2023-11-02 NOTE — Patient Instructions (Addendum)
 Medication Instructions:   START crestor 5mg  once daily for cholesterol   *If you need a refill on your cardiac medications before your next appointment, please call your pharmacy*   Lab Work:  FASTING lab work in 6 months with Dr. Rennis Golden  If you have labs (blood work) drawn today and your tests are completely normal, you will receive your results only by: MyChart Message (if you have MyChart) OR A paper copy in the mail If you have any lab test that is abnormal or we need to change your treatment, we will call you to review the results.   Follow-Up: At [facility], you and your health needs are our priority.  As part of our continuing mission to provide you with exceptional heart care, we have created designated Provider Care Teams.  These Care Teams include your primary Cardiologist (physician) and Advanced Practice Providers (APPs -  Physician Assistants and Nurse Practitioners) who all work together to provide you with the care you need, when you need it.  We recommend signing up for the patient portal called "MyChart".  Sign up information is provided on this After Visit Summary.  MyChart is used to connect with patients for Virtual Visits (Telemedicine).  Patients are able to view lab/test results, encounter notes, upcoming appointments, etc.  Non-urgent messages can be sent to your provider as well.   To learn more about what you can do with MyChart, go to [email or web address].    Your next appointment:   6 months with Eligha Bridegroom NP -- lipid clinic

## 2023-11-02 NOTE — Progress Notes (Signed)
 OFFICE NOTE  Chief Complaint:  Follow-up  Primary Care Physician: Fatima Sanger, FNP  HPI:  Stacie Gray is a 89 y.o. female with a past medical history significant for remote pericarditis in the setting of fever however she did not have any chest pain in 1996, DCIS status post lumpectomy in 2002, anxiety and osteoarthritis. Recently she's been having some more shortness of breath which she feels may be related to anxiety. She's been caring for her husband who is COPD. She's had several episodes where she felt like she was anxious and her heart was racing however the anxiety likely preceded the tachycardia. She denies any chest pain but does get short of breath when doing some activities. There is no significant family history of coronary disease and she has few cardiac risk factors other than age. There was a remote history of possible pericarditis in the 1990s where she had fever but did not have chest discomfort. She had an echo at that time, but those results are not immediately available. EKG was performed in the office today which was abnormal showing sinus rhythm at 62, possible anterior infarct pattern and T-wave abnormality laterally concerning for ischemia.  07/28/2016  Stacie Gray returns today for follow-up. I'm pleased to report her nuclear stress test was negative for ischemia. LV function is stable. Her echo shows normal LV systolic function with mild diastolic dysfunction and trivial aortic insufficiency. This does not necessarily explain her shortness of breath or chest discomfort. She is under significant amount of stress however caring for her husband and I think that's also contributing to her issues. Blood pressure notably was elevated previously 152/76. Today it's elevated 149/80. I suspect the hypertension could be causing some of her symptoms. She does report some tension over her temples which may occur in the afternoons and could represent a manifestation of  her hypertension.  11/07/2022  Stacie Gray is seen today to reestablish cardiology.  She is considered a new patient having seen me last in 2017.  She was referred back for evaluation of coronary artery calcification seen on recent CT scan.  When I last saw her and 2017 she underwent an echocardiogram and stress test which showed no evidence of ischemia and normal LV function.  She had no known coronary disease but recently had to pulmonary infections and underwent a CT scan of the chest which indicated some coronary and aortic atherosclerosis.  Her last lipid profile in March 2023 showed total cholesterol 195, HDL 44, triglycerides 168 and LDL 126.  Her chart indicates an order for 5 mg of rosuvastatin but she notes that she does not have this medicine nor is she taking it.  She is asymptomatic, denying any chest pain or shortness of breath with exertion.  11/02/2023  Stacie Gray seen today for follow-up.  She had repeat lipids which are minimally changed compared to her prior labs.  Her total cholesterol now 193, HDL 53, triglycerides 122 and LDL 116.  Previous LDL was 126.  She was supposed to be taking rosuvastatin but notes that she initially had some muscle aches with it.  Then she did some reading suggesting that it might worsen memory issues and she was concerned about it and decided not to take the medicine.  We discussed at length some of the benefits of lipid-lowering as far as keeping her coronary artery disease from progressing as well as reduction in risk of heart attack and stroke.  She then inquired about possibly taking the medication  every other day which is reasonable and may still maintain decent serum levels in order to lower cholesterol.  PMHx:  Past Medical History:  Diagnosis Date   Anxiety    DCIS (ductal carcinoma in situ)    Osteoporosis    Pericarditis     Past Surgical History:  Procedure Laterality Date   BREAST LUMPECTOMY      FAMHx:  Family History   Problem Relation Age of Onset   Heart disease Paternal Grandmother    Stroke Paternal Grandfather     SOCHx:   reports that she has never smoked. She has never used smokeless tobacco. She reports that she does not drink alcohol and does not use drugs.  ALLERGIES:  Allergies  Allergen Reactions   Codeine     Other Reaction(s): dizzy   Methocarbamol Nausea Only    ROS: Pertinent items noted in HPI and remainder of comprehensive ROS otherwise negative.  HOME MEDS: Current Outpatient Medications on File Prior to Visit  Medication Sig Dispense Refill   bethanechol (URECHOLINE) 25 MG tablet 1 tablet 1 hour before or 2 hours after meals Orally Three times a day for 30 day(s)     BIOTIN PO Take by mouth.     cetirizine (ZYRTEC ALLERGY) 10 MG tablet Take 10 mg by mouth as needed.     Cholecalciferol (VITAMIN D3 PO) Take 500 mg by mouth daily.     clonazePAM (KLONOPIN) 0.5 MG tablet Take 0.5 mg by mouth at bedtime as needed.  4   losartan (COZAAR) 50 MG tablet Take 50 mg by mouth daily.  5   Magnesium 500 MG TABS Take 500 mg by mouth daily.     metroNIDAZOLE (METROCREAM) 0.75 % cream      Azelastine HCl 137 MCG/SPRAY SOLN Place into both nostrils. (Patient not taking: Reported on 11/02/2023)     benzonatate (TESSALON) 200 MG capsule Take by mouth. (Patient not taking: Reported on 11/02/2023)     doxycycline (VIBRAMYCIN) 100 MG capsule 1 capsule Orally Twice a day (Patient not taking: Reported on 11/02/2023)     oseltamivir (TAMIFLU) 75 MG capsule Take 75 mg by mouth 2 (two) times daily. (Patient not taking: Reported on 11/02/2023)     predniSONE (DELTASONE) 20 MG tablet Take 40 mg by mouth daily. (Patient not taking: Reported on 11/02/2023)     rosuvastatin (CRESTOR) 5 MG tablet TAKE 1 TABLET (5 MG TOTAL) BY MOUTH DAILY. NEEDS LABS AFTER THE 90 DAYS TO CONTINUE DOSAGE. (Patient not taking: Reported on 11/02/2023) 90 tablet 0   No current facility-administered medications on file prior to  visit.    LABS/IMAGING: No results found for this or any previous visit (from the past 48 hours). No results found.  WEIGHTS: Wt Readings from Last 3 Encounters:  11/02/23 150 lb 12.8 oz (68.4 kg)  10/09/22 144 lb 9.6 oz (65.6 kg)  12/07/17 145 lb (65.8 kg)    VITALS: BP (!) 148/72   Pulse 77   Ht 5\' 5"  (1.651 m)   Wt 150 lb 12.8 oz (68.4 kg)   SpO2 95%   BMI 25.09 kg/m   EXAM: Deferred  EKG: EKG Interpretation Date/Time:  Tuesday November 02 2023 13:38:34 EDT Ventricular Rate:  80 PR Interval:  146 QRS Duration:  84 QT Interval:  382 QTC Calculation: 440 R Axis:   41  Text Interpretation: Normal sinus rhythm Nonspecific ST and T wave abnormality No significant change since last tracing Confirmed by Zoila Shutter [phone]) on 11/02/2023  1:51:14 PM    ASSESSMENT: CT evidence of coronary artery calcification/aortic atherosclerosis Progressive dyspnea on exertion - low risk Myoview and normal LVEF on echo (06/2016) Abnormal EKG with ischemic changes Anxiety Remote history of pericarditis Hypertension  PLAN: 1.   Stacie Gray did not take her statin medication initially because of some concerns about worsening arthritis in the morning but then apparently due to some concerns about memory issues that she was reading about.  She now feels that she may try the medication and can contact us if she has any side effects.  Blood pressure is elevated today however she says her home readings are better controlled.  I encouraged her to monitor that.  She denies any progressive worsening shortness of breath or chest pain.  Will plan on ordering repeat lipids in 6 months and follow-up at that time.  Chrystie Nose, MD, [facility], FACP  [facility]  Medical Director of the [facility] Lipid Disorders &  Cardiovascular Risk Reduction Clinic Diplomate of the American Board of Clinical Lipidology Attending Cardiologist  Direct Dial: [phone]  Fax: [phone]   Website:  [email or web address] [email or web address] 11/02/2023, 1:51 PM

## 2023-11-05 DIAGNOSIS — I10 Essential (primary) hypertension: Secondary | ICD-10-CM | POA: Diagnosis not present

## 2023-11-05 DIAGNOSIS — E559 Vitamin D deficiency, unspecified: Secondary | ICD-10-CM | POA: Diagnosis not present

## 2023-11-05 DIAGNOSIS — Z0000 Encounter for general adult medical examination without abnormal findings: Secondary | ICD-10-CM | POA: Diagnosis not present

## 2023-11-05 DIAGNOSIS — E7800 Pure hypercholesterolemia, unspecified: Secondary | ICD-10-CM | POA: Diagnosis not present

## 2023-11-05 LAB — LAB REPORT - SCANNED: EGFR: 45

## 2023-11-11 DIAGNOSIS — I700 Atherosclerosis of aorta: Secondary | ICD-10-CM | POA: Diagnosis not present

## 2023-11-11 DIAGNOSIS — I2510 Atherosclerotic heart disease of native coronary artery without angina pectoris: Secondary | ICD-10-CM | POA: Diagnosis not present

## 2023-11-11 DIAGNOSIS — E559 Vitamin D deficiency, unspecified: Secondary | ICD-10-CM | POA: Diagnosis not present

## 2023-11-11 DIAGNOSIS — I10 Essential (primary) hypertension: Secondary | ICD-10-CM | POA: Diagnosis not present

## 2023-11-11 DIAGNOSIS — Z0000 Encounter for general adult medical examination without abnormal findings: Secondary | ICD-10-CM | POA: Diagnosis not present

## 2023-12-08 DIAGNOSIS — D485 Neoplasm of uncertain behavior of skin: Secondary | ICD-10-CM | POA: Diagnosis not present

## 2023-12-08 DIAGNOSIS — C4442 Squamous cell carcinoma of skin of scalp and neck: Secondary | ICD-10-CM | POA: Diagnosis not present

## 2024-03-16 DIAGNOSIS — N130 Hydronephrosis with ureteropelvic junction obstruction: Secondary | ICD-10-CM | POA: Diagnosis not present

## 2024-03-16 DIAGNOSIS — R3914 Feeling of incomplete bladder emptying: Secondary | ICD-10-CM | POA: Diagnosis not present

## 2024-03-16 DIAGNOSIS — N281 Cyst of kidney, acquired: Secondary | ICD-10-CM | POA: Diagnosis not present

## 2024-04-19 DIAGNOSIS — Z1231 Encounter for screening mammogram for malignant neoplasm of breast: Secondary | ICD-10-CM | POA: Diagnosis not present

## 2024-05-03 DIAGNOSIS — E785 Hyperlipidemia, unspecified: Secondary | ICD-10-CM | POA: Diagnosis not present

## 2024-05-04 LAB — LIPID PANEL
Chol/HDL Ratio: 4.2 ratio (ref 0.0–4.4)
Cholesterol, Total: 203 mg/dL — ABNORMAL HIGH (ref 100–199)
HDL: 48 mg/dL (ref >39)
LDL Chol Calc (NIH): 127 mg/dL — ABNORMAL HIGH (ref 0–99)
Triglycerides: 158 mg/dL — ABNORMAL HIGH (ref 0–149)
VLDL Cholesterol Cal: 28 mg/dL (ref 5–40)

## 2024-05-08 ENCOUNTER — Ambulatory Visit: Payer: Self-pay | Admitting: Internal Medicine

## 2024-05-10 ENCOUNTER — Encounter (HOSPITAL_BASED_OUTPATIENT_CLINIC_OR_DEPARTMENT_OTHER): Payer: Self-pay | Admitting: Nurse Practitioner

## 2024-05-10 ENCOUNTER — Ambulatory Visit (HOSPITAL_BASED_OUTPATIENT_CLINIC_OR_DEPARTMENT_OTHER): Admitting: Nurse Practitioner

## 2024-05-10 VITALS — BP 130/62 | HR 80 | Resp 17 | Ht 65.0 in | Wt 148.0 lb

## 2024-05-10 DIAGNOSIS — I2510 Atherosclerotic heart disease of native coronary artery without angina pectoris: Secondary | ICD-10-CM

## 2024-05-10 DIAGNOSIS — I10 Essential (primary) hypertension: Secondary | ICD-10-CM

## 2024-05-10 DIAGNOSIS — I700 Atherosclerosis of aorta: Secondary | ICD-10-CM

## 2024-05-10 DIAGNOSIS — E785 Hyperlipidemia, unspecified: Secondary | ICD-10-CM

## 2024-05-10 MED ORDER — ROSUVASTATIN CALCIUM 5 MG PO TABS: 5.0000 mg | ORAL_TABLET | ORAL | 3 refills | Status: AC

## 2024-05-10 NOTE — Progress Notes (Signed)
 Cardiology Office Note   Date:  05/10/2024  ID:  Stacie Gray, DOB Jan 22, 1934, MRN [id number] PCP: Royden Ronal Czar, FNP  [facility] Providers Cardiologist:  Vinie JAYSON Maxcy, MD     PMH Dyslipidemia Remote history pericarditis DCIS s/p lumpectomy in 2002 Anxiety Abnormal EKG  Initially seen by Dr. Maxcy 05/2016.  She reported history of remote pericarditis in the setting of fever, however she did not have any chest pain in 1996.  DCIS status postlumpectomy in 2002.  She had been having more shortness of breath which she felt may be related to anxiety.  She was caring for her husband who has COPD and had several episodes where she felt anxious and heart was racing.  Also had shortness of breath with activity.  She denied chest pain.  No significant family history of heart disease.  EKG at that visit revealed sinus rhythm at 62, possible anterior infarct pattern and T wave abnormality laterally concerning for ischemia.   Echo 05/2016 revealed normal LVEF 55 to 60%, grade 1 diastolic dysfunction, mild LVH, mild LAE, and trace AI.  Lexiscan  Myoview  05/2016 was low risk with no evidence of ischemia or infarction.  She reestablished care with Dr. Maxcy 10/09/22 for coronary artery calcification seen on recent CT.  She had recently had pulmonary infection and underwent CT of chest which showed coronary and aortic atherosclerosis.  Her lipid profile March 2023 revealed total cholesterol 195, HDL 44, triglycerides 168, and LDL 126.  Her chart indicated rosuvastatin  5 mg had been prescribed, but she did not have the medication.  Repeat lipid panel revealed total cholesterol 193, HDL 53, triglycerides 122, and LDL 116.  She was advised to take rosuvastatin  but reported muscle ache with the medication.  She was also concerned that it might worsen memory issues.  She was advised to start rosuvastatin  5 mg daily and return in 6 months for fasting labs.  Lipid panel completed 05/03/2024 with  total cholesterol 203, triglycerides 158, HDL 48, and LDL-C 127.  History of Present Illness Discussed the use of AI scribe software for clinical note transcription with the patient, who gave verbal consent to proceed.  History of Present Illness Stacie Gray is a pleasant 89 year old female who presents for follow-up of hyperlipidemia.  She discontinued rosuvastatin  due to concerns about potential cognitive side effects, although she did not experience any cognitive symptoms. She reports occasional difficulty recalling a specific name or place, but no significant worsening of this while on rosuvastatin .  Her most recent LDL cholesterol was 127 mg/dL on labs completed 0/82/7974.  Advised that based on chest CT scan in November 2023 that showed coronary artery and aortic atherosclerotic calcification, goal LDL is 70 or lower.  She remains active with regular housework and yard work. Notes a feeling of tiredness in her back occurs after about 30 minutes of raking leaves, that improves with rest. She engages in activities like blowing leaves off her driveway and moderate intensity housework like scrubbing the floor.  [facility] not [facility] that recently. She takes losartan for hypertension, prescribed after her blood pressure increased to 140 mmHg. She does not monitor her blood pressure at home and has not reported any issues with it during other medical appointments.  She denies chest pain, shortness of breath, orthopnea, PND, edema, palpitations, presyncope, syncope.  ROS: See HPI  Studies Reviewed       No results found  for: LIPOA  Risk Assessment/Calculations           Physical Exam VS:  BP 130/62 (BP Location: Left Arm, Patient Position: Sitting, Cuff Size: Normal)   Pulse 80   Resp 17   Ht 5' 5 (1.651 m)   Wt 148 lb (67.1 kg)   SpO2 96%   BMI 24.63 kg/m    Wt Readings from Last 3 Encounters:  05/10/24 148 lb  (67.1 kg)  11/02/23 150 lb 12.8 oz (68.4 kg)  10/09/22 144 lb 9.6 oz (65.6 kg)    GEN: Well nourished, well developed in no acute distress NECK: No JVD; No carotid bruits CARDIAC: RRR, no murmurs, rubs, gallops RESPIRATORY:  Clear to auscultation without rales, wheezing or rhonchi  ABDOMEN: Soft, non-tender, non-distended EXTREMITIES:  No edema; No deformity    Assessment & Plan Coronary artery calcification Aortic atherosclerosis History of abnormal EKG concerning for ischemia in 2017 with subsequent nuclear stress test that was low risk. Chest CT 06/2022 shows coronary artery and aortic atherosclerotic calcification, indicating chronic atherosclerosis. She denies chest pain, dyspnea, or other symptoms concerning for angina.  No indication for further ischemic evaluation at this time.  -Advised to gradually increase physical activity and monitor for concerning cardiac symptoms - Aim for at least 150 minutes of moderate intensity exercise each week -Heart healthy mostly whole food diet avoiding saturated fat, processed foods, sugar, and other simple carbohydrates - Continue rosuvastatin , losartan  Hyperlipidemia LDL goal < 70 Lipid panel completed 05/03/2024 with total cholesterol 203, HDL 48, LDL 127, and triglycerides 158. LDL cholesterol is elevated at 127 mg/dL, with a target of 70 mg/dL due to coronary calcification. Discussed concerns about statin use and cognitive effects. Rosuvastatin  is preferred due to minimal cognitive risk.  Her sister is tolerating rosuvastatin  every other day, which she is willing to consider. Admits she is active at home but could increase moderate intensity exercise. -Consider rosuvastatin  5 mg three times a week (Monday, Wednesday, Friday) -Consider repeat cholesterol testing with primary care in February or March  - Return in 6 months for follow-up - Aim for at least 150 minutes of moderate intensity exercise each week -Heart healthy mostly whole food  diet avoiding saturated fat, processed foods, sugar, and other simple carbohydrates  Hypertension   Blood pressure is controlled at 130/62 mmHg with losartan.  Reports she has read that blood pressure of 140 and her age is tolerable.   -Continue current antihypertensive therapy with losartan for goal BP < 140/80        Dispo: 6 months with Dr. Mona or me  Signed, Rosaline Bane, NP-C

## 2024-05-10 NOTE — Patient Instructions (Signed)
 Medication Instructions:   RESTART Rosuvastatin  one (1) tablet by mouth ( 5 mg) Mon, Wed and Friday's.  *If you need a refill on your cardiac medications before your next appointment, please call your pharmacy*  Lab Work:  None ordered.  If you have labs (blood work) drawn today and your tests are completely normal, you will receive your results only by: MyChart Message (if you have MyChart) OR A paper copy in the mail If you have any lab test that is abnormal or we need to change your treatment, we will call you to review the results.  Testing/Procedures:  None ordered.  Follow-Up: At [facility], you and your health needs are our priority.  As part of our continuing mission to provide you with exceptional heart care, our providers are all part of one team.  This team includes your primary Cardiologist (physician) and Advanced Practice Providers or APPs (Physician Assistants and Nurse Practitioners) who all work together to provide you with the care you need, when you need it.  Your next appointment:   6 month(s)  Provider:   K. Italy Hilty, MD or Rosaline Bane, NP    We recommend signing up for the patient portal called MyChart.  Sign up information is provided on this After Visit Summary.  MyChart is used to connect with patients for Virtual Visits (Telemedicine).  Patients are able to view lab/test results, encounter notes, upcoming appointments, etc.  Non-urgent messages can be sent to your provider as well.   To learn more about what you can do with MyChart, go to [email or web address].   Other Instructions  Your physician wants you to follow-up in: 6 months.  You will receive a reminder letter in the mail two months in advance. If you don't receive a letter, please call our office to schedule the follow-up appointment.

## 2024-06-28 DIAGNOSIS — I739 Peripheral vascular disease, unspecified: Secondary | ICD-10-CM | POA: Diagnosis not present

## 2024-06-28 DIAGNOSIS — E785 Hyperlipidemia, unspecified: Secondary | ICD-10-CM | POA: Diagnosis not present

## 2024-06-28 DIAGNOSIS — N3941 Urge incontinence: Secondary | ICD-10-CM | POA: Diagnosis not present

## 2024-06-28 DIAGNOSIS — M2040 Other hammer toe(s) (acquired), unspecified foot: Secondary | ICD-10-CM | POA: Diagnosis not present

## 2024-06-28 DIAGNOSIS — I2510 Atherosclerotic heart disease of native coronary artery without angina pectoris: Secondary | ICD-10-CM | POA: Diagnosis not present

## 2024-06-28 DIAGNOSIS — M2010 Hallux valgus (acquired), unspecified foot: Secondary | ICD-10-CM | POA: Diagnosis not present

## 2024-06-28 DIAGNOSIS — R339 Retention of urine, unspecified: Secondary | ICD-10-CM | POA: Diagnosis not present

## 2024-06-28 DIAGNOSIS — F419 Anxiety disorder, unspecified: Secondary | ICD-10-CM | POA: Diagnosis not present

## 2024-06-28 DIAGNOSIS — N1831 Chronic kidney disease, stage 3a: Secondary | ICD-10-CM | POA: Diagnosis not present

## 2024-07-12 DIAGNOSIS — H903 Sensorineural hearing loss, bilateral: Secondary | ICD-10-CM | POA: Diagnosis not present
